# Patient Record
Sex: Female | Born: 1970 | Race: White | Hispanic: No | Marital: Married | Smoking: Current every day smoker
Health system: Southern US, Community
[De-identification: ages and names within clinical notes are randomized; demographics above are authoritative.]

## PROBLEM LIST (undated history)

## (undated) DIAGNOSIS — G894 Chronic pain syndrome: Secondary | ICD-10-CM

## (undated) DIAGNOSIS — J449 Chronic obstructive pulmonary disease, unspecified: Secondary | ICD-10-CM

## (undated) DIAGNOSIS — I10 Essential (primary) hypertension: Secondary | ICD-10-CM

## (undated) DIAGNOSIS — K760 Fatty (change of) liver, not elsewhere classified: Secondary | ICD-10-CM

## (undated) DIAGNOSIS — F17210 Nicotine dependence, cigarettes, uncomplicated: Secondary | ICD-10-CM

## (undated) DIAGNOSIS — R011 Cardiac murmur, unspecified: Secondary | ICD-10-CM

## (undated) DIAGNOSIS — K219 Gastro-esophageal reflux disease without esophagitis: Secondary | ICD-10-CM

## (undated) DIAGNOSIS — F0280 Dementia in other diseases classified elsewhere without behavioral disturbance: Secondary | ICD-10-CM

## (undated) DIAGNOSIS — E559 Vitamin D deficiency, unspecified: Secondary | ICD-10-CM

## (undated) DIAGNOSIS — R0600 Dyspnea, unspecified: Secondary | ICD-10-CM

## (undated) DIAGNOSIS — Z8489 Family history of other specified conditions: Secondary | ICD-10-CM

## (undated) DIAGNOSIS — E785 Hyperlipidemia, unspecified: Secondary | ICD-10-CM

## (undated) DIAGNOSIS — M797 Fibromyalgia: Secondary | ICD-10-CM

## (undated) DIAGNOSIS — M1990 Unspecified osteoarthritis, unspecified site: Secondary | ICD-10-CM

## (undated) DIAGNOSIS — D241 Benign neoplasm of right breast: Secondary | ICD-10-CM

## (undated) HISTORY — PX: OTHER SURGICAL HISTORY: SHX169

## (undated) HISTORY — PX: DILATION AND CURETTAGE OF UTERUS: SHX78

---

## 2005-12-16 ENCOUNTER — Emergency Department: Payer: Self-pay | Admitting: General Practice

## 2008-01-30 ENCOUNTER — Other Ambulatory Visit: Payer: Self-pay

## 2008-01-30 ENCOUNTER — Emergency Department: Payer: Self-pay | Admitting: Emergency Medicine

## 2008-04-09 ENCOUNTER — Emergency Department: Payer: Self-pay | Admitting: Internal Medicine

## 2008-04-09 ENCOUNTER — Ambulatory Visit: Payer: Self-pay | Admitting: Unknown Physician Specialty

## 2008-04-12 ENCOUNTER — Ambulatory Visit: Payer: Self-pay | Admitting: Unknown Physician Specialty

## 2010-05-05 ENCOUNTER — Ambulatory Visit: Payer: Self-pay | Admitting: Internal Medicine

## 2010-05-21 ENCOUNTER — Other Ambulatory Visit: Payer: Self-pay | Admitting: Internal Medicine

## 2010-11-21 ENCOUNTER — Ambulatory Visit: Payer: Self-pay | Admitting: Rheumatology

## 2011-03-04 ENCOUNTER — Ambulatory Visit: Payer: Self-pay

## 2011-03-27 ENCOUNTER — Other Ambulatory Visit: Payer: Self-pay | Admitting: Internal Medicine

## 2011-03-27 DIAGNOSIS — M797 Fibromyalgia: Secondary | ICD-10-CM

## 2011-03-27 MED ORDER — GABAPENTIN 400 MG PO CAPS: 400.0000 mg | ORAL_CAPSULE | Freq: Every day | ORAL | Status: DC

## 2011-04-03 ENCOUNTER — Ambulatory Visit: Payer: Self-pay | Admitting: Rheumatology

## 2011-09-10 ENCOUNTER — Telehealth: Payer: Self-pay | Admitting: *Deleted

## 2011-09-10 ENCOUNTER — Telehealth: Payer: Self-pay | Admitting: Internal Medicine

## 2011-09-10 NOTE — Telephone Encounter (Signed)
Caller: Sharmila/Patient; PCP: Duncan Dull; CB#: [phone]; ; ; Call regarding Back Pain;  Patient is calling about patient is having severe back pain that is also causing a flair up of her fibromyalgia.  Onset 08/23/11 but has recently gotten worse within last 2-3 days.   Patient is complaining of both lower and upper back pain.  Unable to sit up straight due to pain. Afebrile.  All emergent s/s r/o with exception to new onset of severe disabling back pain per Back Symptoms protocol.  Advised patient to go to ED for evaluation as office is closed, will go to [facility].

## 2011-09-10 NOTE — Telephone Encounter (Signed)
error 

## 2011-09-14 NOTE — Telephone Encounter (Signed)
[facility] Triage Call Report Triage Record Num: [id number] Operator: Tarri Glenn Patient Name: Andrea Frazier Call Date & Time: 09/10/2011 5:27:46PM Patient Phone: [phone] PCP: Duncan Dull Patient Gender: Female PCP Fax : [phone] Patient DOB: 01-Sep-1970 Practice Name: [facility] Reason for Call: Caller: [name]/Patient; PCP: Duncan Dull; CB#: [phone]; ; ; Call regarding Back Pain; Patient is calling about patient is having severe back pain that is also causing a flair up of her fibromyalgia. Onset 08/23/11 but has recently gotten worse within last 2-3 days. Patient is complaining of both lower and upper back pain. Unable to sit up straight due to pain. Afebrile. All emergent s/s r/o with exception to new onset of severe disabling back pain per Back Symptoms protocol. Advised patient to go to ED for evaluation as office is closed, will go to [facility]. Protocol(s) Used: Back Symptoms Recommended Outcome per Protocol: See ED Immediately Reason for Outcome: New onset of severe disabling back pain (unable to stand upright) Care Advice: ~ Another adult should drive. ~ Do not give the patient anything to eat or drink. Write down provider's name. List or place the following in a bag for transport with the patient: current prescription and/or nonprescription medications; alternative treatments, therapies and medications; and street drugs. ~ 09/10/2011 5:43:39PM Page 1 of 1 [id number]

## 2012-01-28 ENCOUNTER — Ambulatory Visit: Payer: Self-pay | Admitting: Internal Medicine

## 2012-09-15 ENCOUNTER — Ambulatory Visit: Payer: Self-pay | Admitting: Internal Medicine

## 2012-09-22 ENCOUNTER — Ambulatory Visit: Payer: Self-pay | Admitting: Internal Medicine

## 2012-09-26 ENCOUNTER — Ambulatory Visit: Payer: Self-pay | Admitting: Internal Medicine

## 2012-10-20 IMAGING — CR PELVIS - 1-2 VIEW
1 series · 1 of 1 positions shown · non-contrast
Comparison: none

REASON FOR EXAM: pain degenerative arthritis scolosis
COMMENTS:

[view not recorded]
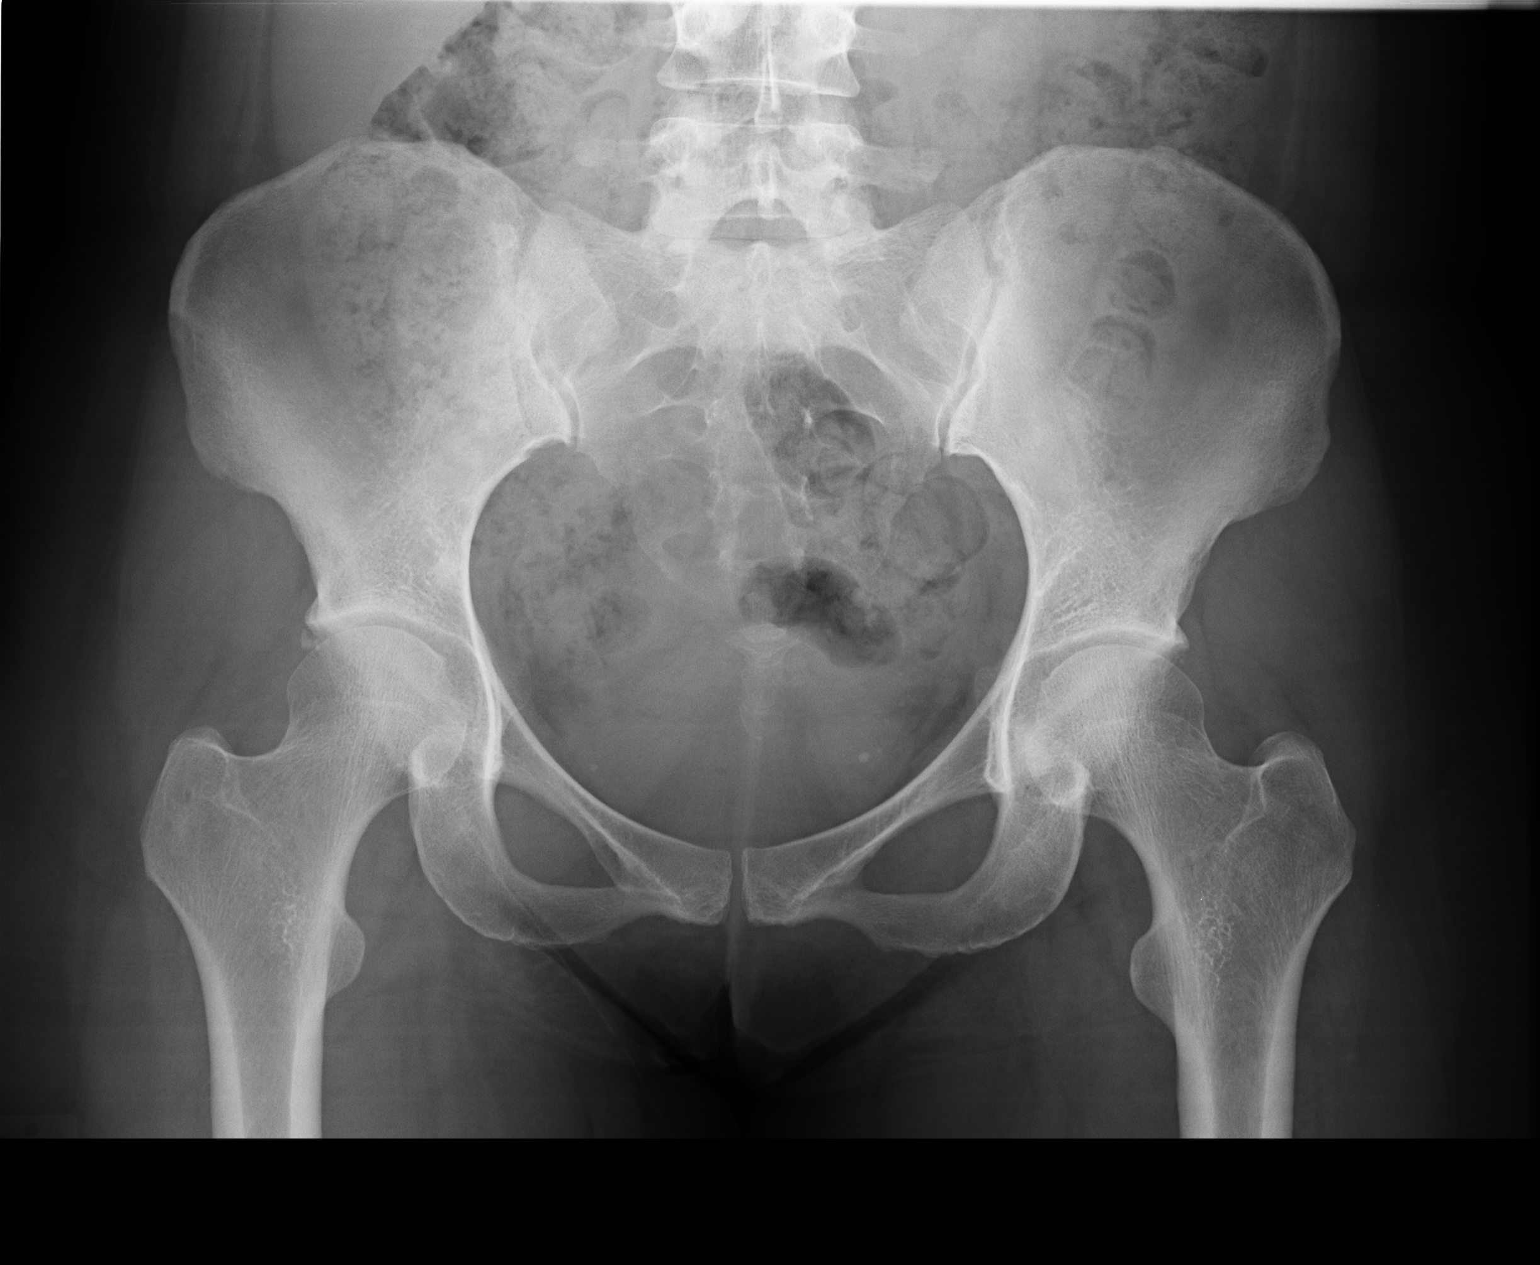

[1 of 1 positions shown; findings below may reference images not displayed]

PROCEDURE:     DXR - DXR PELVIS AP ONLY  - November 21, 2010  [DATE]

RESULT:     An AP view of the bony pelvis shows no fracture or other acute
bony abnormality. The joint spaces are bilaterally symmetrical. No arthritic
change about the hips is seen. The sacroiliac joints are normal in
appearance.
IMPRESSION: 1.     No significant abnormalities are noted.

## 2012-12-14 ENCOUNTER — Ambulatory Visit: Payer: Self-pay

## 2013-01-03 ENCOUNTER — Ambulatory Visit: Payer: Self-pay | Admitting: Unknown Physician Specialty

## 2013-05-19 ENCOUNTER — Ambulatory Visit: Payer: Self-pay | Admitting: Unknown Physician Specialty

## 2014-05-27 ENCOUNTER — Emergency Department: Payer: Self-pay | Admitting: Internal Medicine

## 2014-05-27 DIAGNOSIS — G839 Paralytic syndrome, unspecified: Secondary | ICD-10-CM | POA: Diagnosis not present

## 2014-05-27 DIAGNOSIS — Z79891 Long term (current) use of opiate analgesic: Secondary | ICD-10-CM | POA: Diagnosis not present

## 2014-05-27 DIAGNOSIS — Z79899 Other long term (current) drug therapy: Secondary | ICD-10-CM | POA: Diagnosis not present

## 2014-05-27 DIAGNOSIS — Z720 Tobacco use: Secondary | ICD-10-CM | POA: Diagnosis not present

## 2014-05-27 DIAGNOSIS — R531 Weakness: Secondary | ICD-10-CM | POA: Diagnosis not present

## 2014-05-27 DIAGNOSIS — Z792 Long term (current) use of antibiotics: Secondary | ICD-10-CM | POA: Diagnosis not present

## 2014-05-27 DIAGNOSIS — Z7952 Long term (current) use of systemic steroids: Secondary | ICD-10-CM | POA: Diagnosis not present

## 2014-05-27 LAB — CBC WITH DIFFERENTIAL/PLATELET
BASOS PCT: 0.6 %
Basophil #: 0 10*3/uL (ref 0.0–0.1)
EOS ABS: 0.4 10*3/uL (ref 0.0–0.7)
EOS PCT: 4.5 %
HCT: 45.2 % (ref 35.0–47.0)
HGB: 15 g/dL (ref 12.0–16.0)
LYMPHS ABS: 3.5 10*3/uL (ref 1.0–3.6)
LYMPHS PCT: 42.9 %
MCH: 30.9 pg (ref 26.0–34.0)
MCHC: 33.2 g/dL (ref 32.0–36.0)
MCV: 93 fL (ref 80–100)
MONO ABS: 0.6 x10 3/mm (ref 0.2–0.9)
Monocyte %: 7.6 %
NEUTROS ABS: 3.6 10*3/uL (ref 1.4–6.5)
NEUTROS PCT: 44.4 %
Platelet: 220 10*3/uL (ref 150–440)
RBC: 4.86 10*6/uL (ref 3.80–5.20)
RDW: 13.8 % (ref 11.5–14.5)
WBC: 8.1 10*3/uL (ref 3.6–11.0)

## 2014-05-27 LAB — BASIC METABOLIC PANEL
Anion Gap: 6 — ABNORMAL LOW (ref 7–16)
BUN: 10 mg/dL (ref 7–18)
CALCIUM: 8.1 mg/dL — AB (ref 8.5–10.1)
CHLORIDE: 103 mmol/L (ref 98–107)
Co2: 29 mmol/L (ref 21–32)
Creatinine: 0.64 mg/dL (ref 0.60–1.30)
GLUCOSE: 82 mg/dL (ref 65–99)
OSMOLALITY: 274 (ref 275–301)
Potassium: 3.6 mmol/L (ref 3.5–5.1)
SODIUM: 138 mmol/L (ref 136–145)

## 2014-05-27 LAB — URINALYSIS, COMPLETE
BILIRUBIN, UR: NEGATIVE
Bacteria: NONE SEEN
GLUCOSE, UR: NEGATIVE mg/dL (ref 0–75)
KETONE: NEGATIVE
NITRITE: NEGATIVE
PH: 7 (ref 4.5–8.0)
Protein: NEGATIVE
RBC,UR: 474 /HPF (ref 0–5)
Specific Gravity: 1.014 (ref 1.003–1.030)
WBC UR: 2 /HPF (ref 0–5)

## 2014-05-27 LAB — TROPONIN I: Troponin-I: <0.02 ng/mL

## 2014-05-31 DIAGNOSIS — G5631 Lesion of radial nerve, right upper limb: Secondary | ICD-10-CM | POA: Diagnosis not present

## 2014-06-26 DIAGNOSIS — M609 Myositis, unspecified: Secondary | ICD-10-CM | POA: Diagnosis not present

## 2014-06-26 DIAGNOSIS — M5126 Other intervertebral disc displacement, lumbar region: Secondary | ICD-10-CM | POA: Diagnosis not present

## 2014-06-26 DIAGNOSIS — M791 Myalgia: Secondary | ICD-10-CM | POA: Diagnosis not present

## 2014-06-26 DIAGNOSIS — F17200 Nicotine dependence, unspecified, uncomplicated: Secondary | ICD-10-CM | POA: Diagnosis not present

## 2014-06-26 DIAGNOSIS — N319 Neuromuscular dysfunction of bladder, unspecified: Secondary | ICD-10-CM | POA: Diagnosis not present

## 2014-06-26 DIAGNOSIS — Z1389 Encounter for screening for other disorder: Secondary | ICD-10-CM | POA: Diagnosis not present

## 2014-08-23 ENCOUNTER — Ambulatory Visit: Payer: Self-pay | Admitting: Internal Medicine

## 2014-10-01 ENCOUNTER — Ambulatory Visit
Admit: 2014-10-01 | Disposition: A | Discharge status: Home / Self Care | Payer: Self-pay | Attending: Anesthesiology | Admitting: Anesthesiology
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
## 2015-07-04 DIAGNOSIS — M5126 Other intervertebral disc displacement, lumbar region: Secondary | ICD-10-CM | POA: Diagnosis not present

## 2015-07-04 DIAGNOSIS — Z882 Allergy status to sulfonamides status: Secondary | ICD-10-CM | POA: Diagnosis not present

## 2015-07-04 DIAGNOSIS — M797 Fibromyalgia: Secondary | ICD-10-CM | POA: Diagnosis not present

## 2015-07-04 DIAGNOSIS — R5382 Chronic fatigue, unspecified: Secondary | ICD-10-CM | POA: Diagnosis not present

## 2015-08-02 DIAGNOSIS — M797 Fibromyalgia: Secondary | ICD-10-CM | POA: Diagnosis not present

## 2015-08-02 DIAGNOSIS — E784 Other hyperlipidemia: Secondary | ICD-10-CM | POA: Diagnosis not present

## 2015-08-02 DIAGNOSIS — I10 Essential (primary) hypertension: Secondary | ICD-10-CM | POA: Diagnosis not present

## 2015-08-02 DIAGNOSIS — M5126 Other intervertebral disc displacement, lumbar region: Secondary | ICD-10-CM | POA: Diagnosis not present

## 2015-08-02 DIAGNOSIS — Z882 Allergy status to sulfonamides status: Secondary | ICD-10-CM | POA: Diagnosis not present

## 2015-08-02 DIAGNOSIS — G894 Chronic pain syndrome: Secondary | ICD-10-CM | POA: Diagnosis not present

## 2015-08-02 DIAGNOSIS — R5381 Other malaise: Secondary | ICD-10-CM | POA: Diagnosis not present

## 2015-08-02 DIAGNOSIS — R5382 Chronic fatigue, unspecified: Secondary | ICD-10-CM | POA: Diagnosis not present

## 2015-08-27 DIAGNOSIS — M5126 Other intervertebral disc displacement, lumbar region: Secondary | ICD-10-CM | POA: Diagnosis not present

## 2015-08-27 DIAGNOSIS — M797 Fibromyalgia: Secondary | ICD-10-CM | POA: Diagnosis not present

## 2015-08-27 DIAGNOSIS — R5382 Chronic fatigue, unspecified: Secondary | ICD-10-CM | POA: Diagnosis not present

## 2015-09-26 DIAGNOSIS — M5126 Other intervertebral disc displacement, lumbar region: Secondary | ICD-10-CM | POA: Diagnosis not present

## 2015-09-26 DIAGNOSIS — R5382 Chronic fatigue, unspecified: Secondary | ICD-10-CM | POA: Diagnosis not present

## 2015-09-26 DIAGNOSIS — M797 Fibromyalgia: Secondary | ICD-10-CM | POA: Diagnosis not present

## 2015-10-25 DIAGNOSIS — M5126 Other intervertebral disc displacement, lumbar region: Secondary | ICD-10-CM | POA: Diagnosis not present

## 2015-10-25 DIAGNOSIS — R5382 Chronic fatigue, unspecified: Secondary | ICD-10-CM | POA: Diagnosis not present

## 2015-10-25 DIAGNOSIS — M797 Fibromyalgia: Secondary | ICD-10-CM | POA: Diagnosis not present

## 2015-11-22 DIAGNOSIS — R5382 Chronic fatigue, unspecified: Secondary | ICD-10-CM | POA: Diagnosis not present

## 2015-11-22 DIAGNOSIS — M5126 Other intervertebral disc displacement, lumbar region: Secondary | ICD-10-CM | POA: Diagnosis not present

## 2015-11-22 DIAGNOSIS — M797 Fibromyalgia: Secondary | ICD-10-CM | POA: Diagnosis not present

## 2015-11-22 DIAGNOSIS — N319 Neuromuscular dysfunction of bladder, unspecified: Secondary | ICD-10-CM | POA: Diagnosis not present

## 2015-12-10 DIAGNOSIS — Z882 Allergy status to sulfonamides status: Secondary | ICD-10-CM | POA: Diagnosis not present

## 2015-12-10 DIAGNOSIS — R5382 Chronic fatigue, unspecified: Secondary | ICD-10-CM | POA: Diagnosis not present

## 2015-12-10 DIAGNOSIS — M5126 Other intervertebral disc displacement, lumbar region: Secondary | ICD-10-CM | POA: Diagnosis not present

## 2015-12-10 DIAGNOSIS — M797 Fibromyalgia: Secondary | ICD-10-CM | POA: Diagnosis not present

## 2015-12-24 DIAGNOSIS — Z882 Allergy status to sulfonamides status: Secondary | ICD-10-CM | POA: Diagnosis not present

## 2015-12-24 DIAGNOSIS — N319 Neuromuscular dysfunction of bladder, unspecified: Secondary | ICD-10-CM | POA: Diagnosis not present

## 2015-12-24 DIAGNOSIS — M797 Fibromyalgia: Secondary | ICD-10-CM | POA: Diagnosis not present

## 2015-12-24 DIAGNOSIS — R5382 Chronic fatigue, unspecified: Secondary | ICD-10-CM | POA: Diagnosis not present

## 2016-01-21 DIAGNOSIS — R5382 Chronic fatigue, unspecified: Secondary | ICD-10-CM | POA: Diagnosis not present

## 2016-01-21 DIAGNOSIS — M797 Fibromyalgia: Secondary | ICD-10-CM | POA: Diagnosis not present

## 2016-01-21 DIAGNOSIS — M5126 Other intervertebral disc displacement, lumbar region: Secondary | ICD-10-CM | POA: Diagnosis not present

## 2016-01-21 DIAGNOSIS — N319 Neuromuscular dysfunction of bladder, unspecified: Secondary | ICD-10-CM | POA: Diagnosis not present

## 2016-02-20 DIAGNOSIS — S21209A Unspecified open wound of unspecified back wall of thorax without penetration into thoracic cavity, initial encounter: Secondary | ICD-10-CM | POA: Diagnosis not present

## 2016-02-20 DIAGNOSIS — N319 Neuromuscular dysfunction of bladder, unspecified: Secondary | ICD-10-CM | POA: Diagnosis not present

## 2016-02-20 DIAGNOSIS — M5430 Sciatica, unspecified side: Secondary | ICD-10-CM | POA: Diagnosis not present

## 2016-02-20 DIAGNOSIS — Z882 Allergy status to sulfonamides status: Secondary | ICD-10-CM | POA: Diagnosis not present

## 2016-03-11 DIAGNOSIS — R300 Dysuria: Secondary | ICD-10-CM | POA: Diagnosis not present

## 2016-03-11 DIAGNOSIS — M546 Pain in thoracic spine: Secondary | ICD-10-CM | POA: Diagnosis not present

## 2016-03-11 DIAGNOSIS — W19XXXA Unspecified fall, initial encounter: Secondary | ICD-10-CM | POA: Diagnosis not present

## 2016-03-11 DIAGNOSIS — M47814 Spondylosis without myelopathy or radiculopathy, thoracic region: Secondary | ICD-10-CM | POA: Diagnosis not present

## 2016-03-11 DIAGNOSIS — M549 Dorsalgia, unspecified: Secondary | ICD-10-CM | POA: Diagnosis not present

## 2016-03-19 DIAGNOSIS — R5381 Other malaise: Secondary | ICD-10-CM | POA: Diagnosis not present

## 2016-03-19 DIAGNOSIS — R5382 Chronic fatigue, unspecified: Secondary | ICD-10-CM | POA: Diagnosis not present

## 2016-03-19 DIAGNOSIS — Z882 Allergy status to sulfonamides status: Secondary | ICD-10-CM | POA: Diagnosis not present

## 2016-03-19 DIAGNOSIS — M797 Fibromyalgia: Secondary | ICD-10-CM | POA: Diagnosis not present

## 2016-03-19 DIAGNOSIS — M5126 Other intervertebral disc displacement, lumbar region: Secondary | ICD-10-CM | POA: Diagnosis not present

## 2016-04-17 DIAGNOSIS — M5126 Other intervertebral disc displacement, lumbar region: Secondary | ICD-10-CM | POA: Diagnosis not present

## 2016-04-17 DIAGNOSIS — N319 Neuromuscular dysfunction of bladder, unspecified: Secondary | ICD-10-CM | POA: Diagnosis not present

## 2016-04-17 DIAGNOSIS — M797 Fibromyalgia: Secondary | ICD-10-CM | POA: Diagnosis not present

## 2016-04-17 DIAGNOSIS — R5382 Chronic fatigue, unspecified: Secondary | ICD-10-CM | POA: Diagnosis not present

## 2016-04-23 ENCOUNTER — Encounter: Payer: Self-pay | Admitting: Obstetrics and Gynecology

## 2016-04-25 IMAGING — CT CT HEAD WITHOUT CONTRAST
1 series · 16 of 30 positions shown, 20 images · non-contrast
Comparison: None.

CLINICAL DATA: Right hand weakness today.  Initial encounter.

EXAM:
CT HEAD WITHOUT CONTRAST
TECHNIQUE: Contiguous axial images were obtained from the base of the skull
through the vertex without intravenous contrast.

[Series 2: head wo · axial · 0.44mm/px · z∈[-162,-36]mm · 16 of 32 slices shown, 20 images]
[im 2/32  brain]
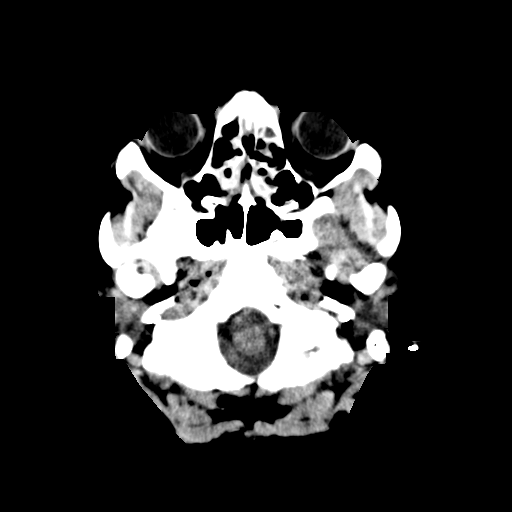
[im 2/32  bone]
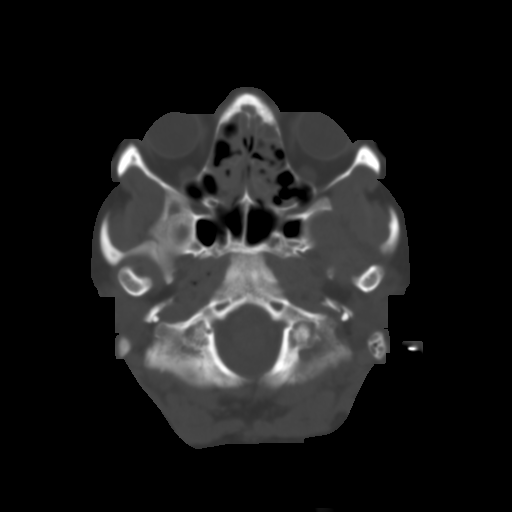
[im 4/32  brain]
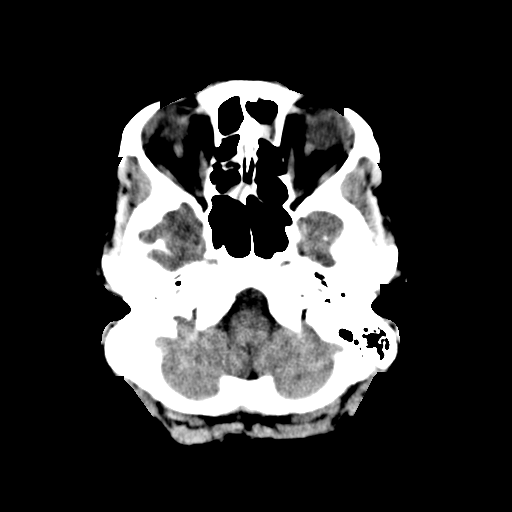
[im 6/32  brain]
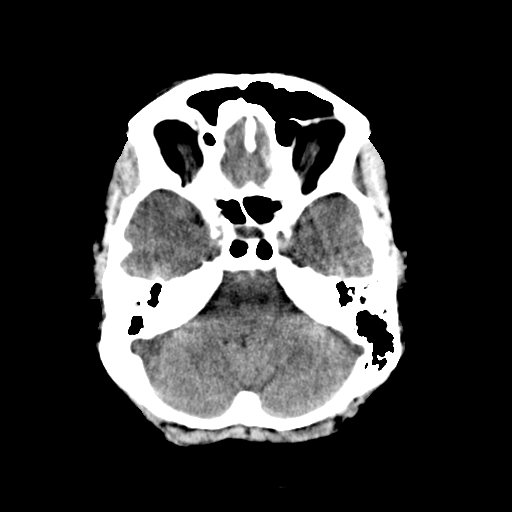
[im 8/32  brain]
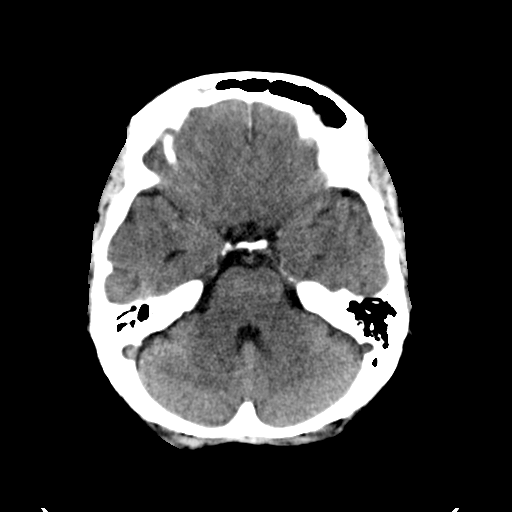
[im 9/32  brain]
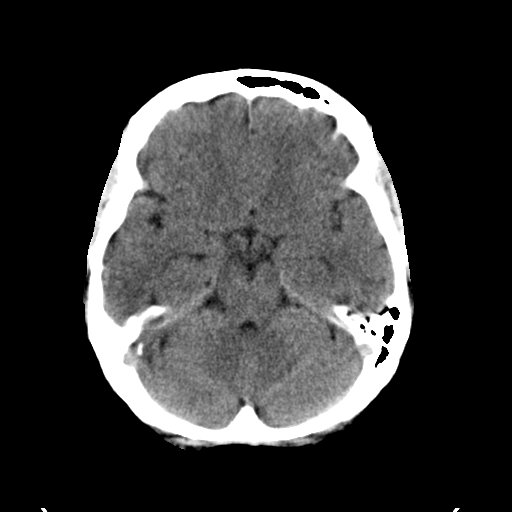
[im 9/32  bone]
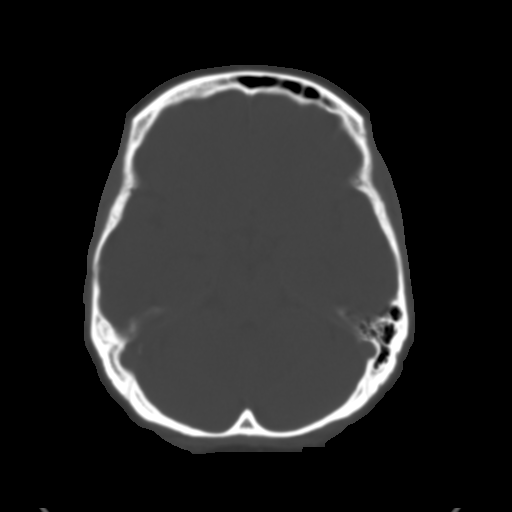
[im 11/32  brain]
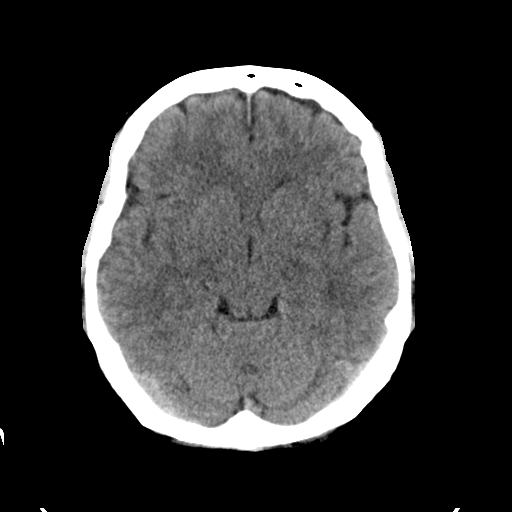
[im 13/32  brain]
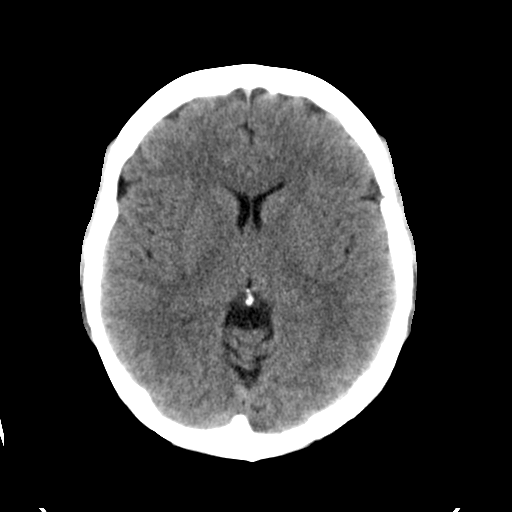
[im 15/32  brain]
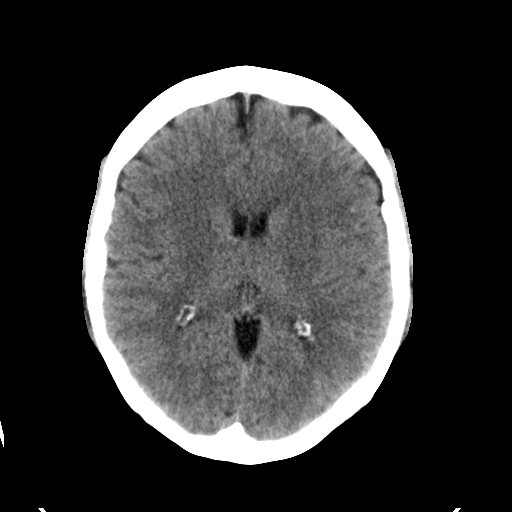
[im 17/32  brain]
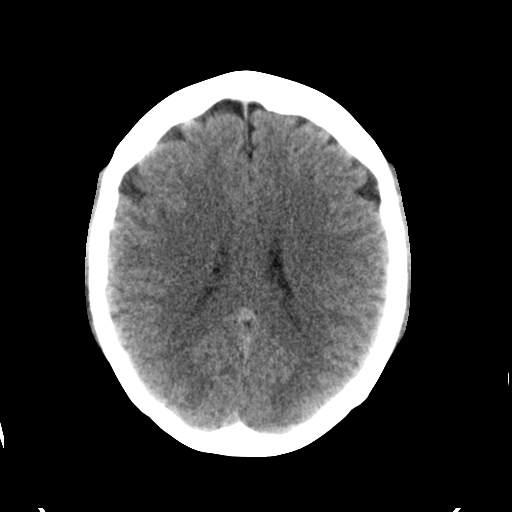
[im 17/32  bone]
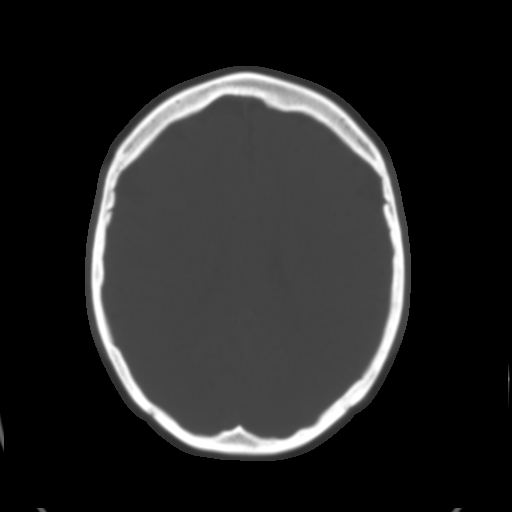
[im 19/32  brain]
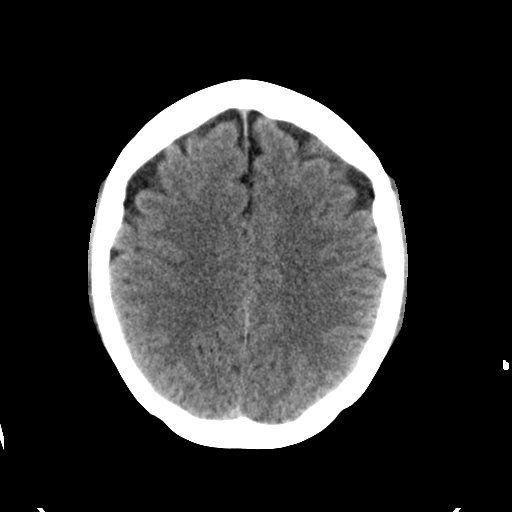
[im 21/32  brain]
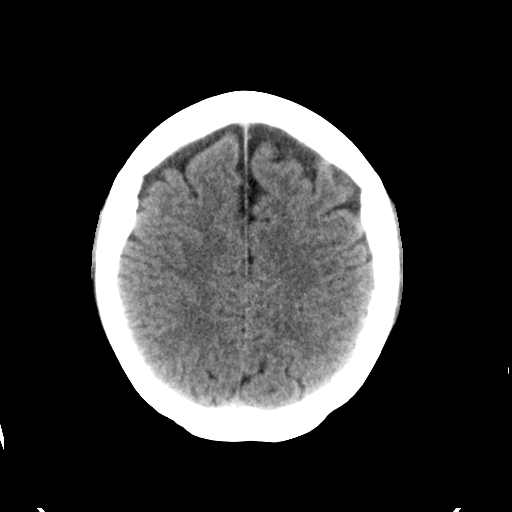
[im 23/32  brain]
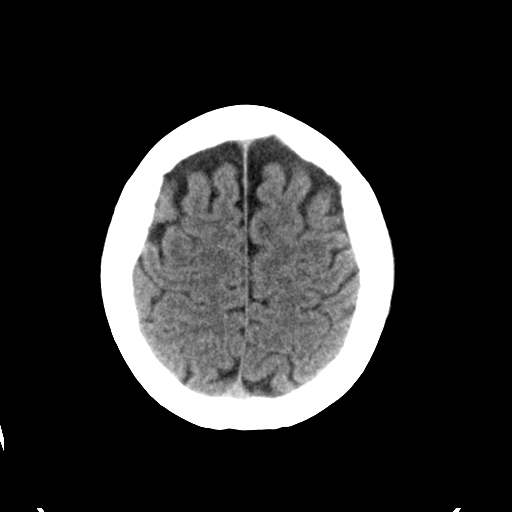
[im 24/32  brain]
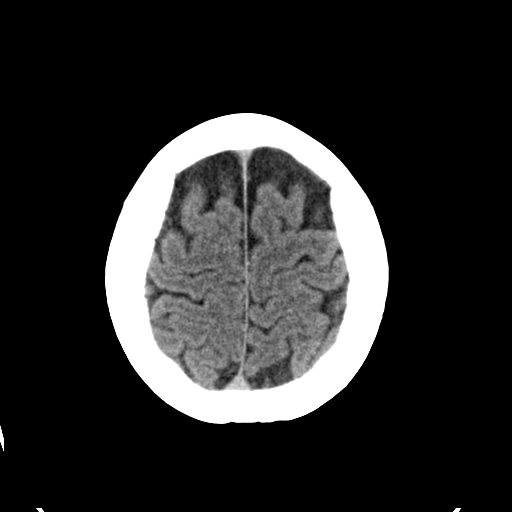
[im 24/32  bone]
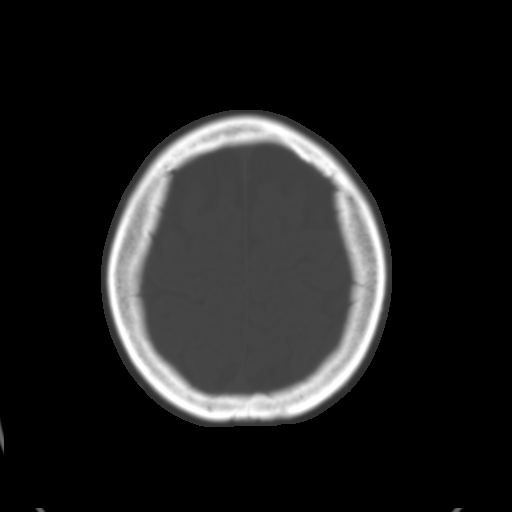
[im 26/32  brain]
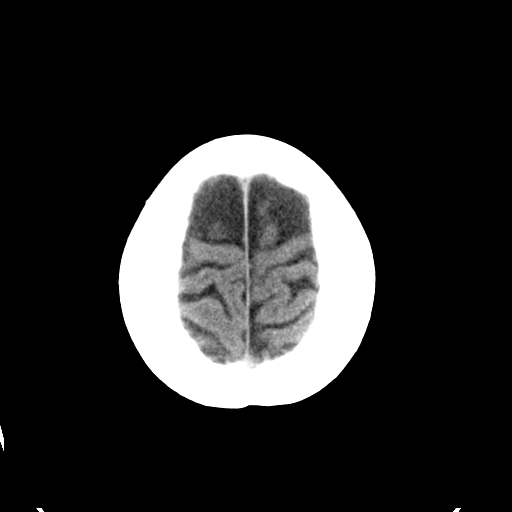
[im 28/32  brain]
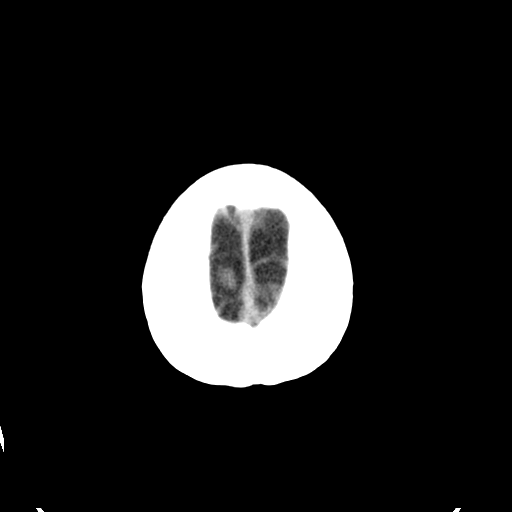
[im 30/32  brain]
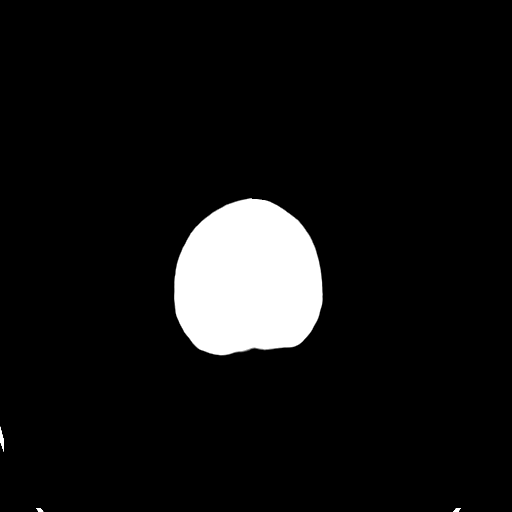

[16 of 30 positions shown; findings below may reference images not displayed]

FINDINGS: There is no evidence of acute intracranial hemorrhage, mass lesion,
brain edema or extra-axial fluid collection. The ventricles and
subarachnoid spaces are appropriately sized for age. There is no CT
evidence of acute cortical infarction.

There is mucosal thickening throughout the ethmoid and frontal
sinuses without air-fluid levels. The visualized sphenoid and
maxillary sinuses are clear. The mastoid air cells on the right are
hypoplastic but not opacified. The middle ears are clear. The
calvarium is intact.
IMPRESSION: 1. No acute intracranial findings.  No CT evidence of acute stroke.
2. Ethmoid and frontal sinus disease.

## 2016-05-18 DIAGNOSIS — N319 Neuromuscular dysfunction of bladder, unspecified: Secondary | ICD-10-CM | POA: Diagnosis not present

## 2016-05-18 DIAGNOSIS — R5382 Chronic fatigue, unspecified: Secondary | ICD-10-CM | POA: Diagnosis not present

## 2016-05-18 DIAGNOSIS — M5126 Other intervertebral disc displacement, lumbar region: Secondary | ICD-10-CM | POA: Diagnosis not present

## 2016-05-18 DIAGNOSIS — M797 Fibromyalgia: Secondary | ICD-10-CM | POA: Diagnosis not present

## 2016-05-27 ENCOUNTER — Encounter: Payer: Self-pay | Admitting: Obstetrics and Gynecology

## 2016-06-18 DIAGNOSIS — R5382 Chronic fatigue, unspecified: Secondary | ICD-10-CM | POA: Diagnosis not present

## 2016-06-18 DIAGNOSIS — M5126 Other intervertebral disc displacement, lumbar region: Secondary | ICD-10-CM | POA: Diagnosis not present

## 2016-06-18 DIAGNOSIS — Z8269 Family history of other diseases of the musculoskeletal system and connective tissue: Secondary | ICD-10-CM | POA: Diagnosis not present

## 2016-06-18 DIAGNOSIS — N319 Neuromuscular dysfunction of bladder, unspecified: Secondary | ICD-10-CM | POA: Diagnosis not present

## 2016-07-01 ENCOUNTER — Encounter: Payer: Self-pay | Admitting: Obstetrics and Gynecology

## 2016-07-02 ENCOUNTER — Encounter: Payer: Self-pay | Admitting: Obstetrics and Gynecology

## 2016-07-06 ENCOUNTER — Encounter: Payer: Self-pay | Admitting: Obstetrics and Gynecology

## 2016-07-06 ENCOUNTER — Ambulatory Visit (INDEPENDENT_AMBULATORY_CARE_PROVIDER_SITE_OTHER): Payer: Self-pay | Admitting: Obstetrics and Gynecology

## 2016-07-06 VITALS — BP 132/87 | HR 85 | Ht 63.5 in | Wt 187.2 lb

## 2016-07-06 DIAGNOSIS — N938 Other specified abnormal uterine and vaginal bleeding: Secondary | ICD-10-CM

## 2016-07-06 DIAGNOSIS — N946 Dysmenorrhea, unspecified: Secondary | ICD-10-CM

## 2016-07-06 DIAGNOSIS — R102 Pelvic and perineal pain: Secondary | ICD-10-CM

## 2016-07-06 NOTE — Progress Notes (Signed)
HPI:      [name] is a 46 y.o. G1P0010 who LMP was Patient's last menstrual period was 06/15/2016 (exact date)..  Subjective: She presents today with complaint of long-standing back and pelvic pain. She states that she has had this for many years. She says that for the last 5 months her pain is much worse with her menses. She reports a remote history of endometriosis found at previous surgery. She reports a tubal ligation. She also complains that on some months she has 2 menses instead of 1. Currently taking multiple pain medications prescribed by others. She describes having a previous hysterectomy scheduled 9 years ago but this was then canceled because "I didn't have insurance"     Hx: The following portions of the patient's history were reviewed and updated as appropriate:            She  has no past medical history on file. She  does not have a problem list on file. She  has a past surgical history that includes Dilation and curettage of uterus. She has a current medication list which includes the following prescription(s): clonazepam, gabapentin, hydrocodone-acetaminophen, rosuvastatin, and tramadol. No current outpatient prescriptions on file prior to visit.   No current facility-administered medications on file prior to visit.           ROS: Constitutional: Denied constitutional symptoms, night sweats, recent illness, fatigue, fever, insomnia and weight loss.  Eyes: Denied eye symptoms, eye pain, photophobia, vision change and visual disturbance.  Ears/Nose/Throat/Neck: Denied ear, nose, throat or neck symptoms, hearing loss, nasal discharge, sinus congestion and sore throat.  Cardiovascular: Denied cardiovascular symptoms, arrhythmia, chest pain/pressure, edema, exercise intolerance, orthopnea and palpitations.  Respiratory: Denied pulmonary symptoms, asthma, pleuritic pain, productive sputum, cough, dyspnea and wheezing.  Gastrointestinal: Denied, gastro-esophageal  reflux, melena, nausea and vomiting.  Genitourinary: See HPI for additional information.  Musculoskeletal: Denied musculoskeletal symptoms, stiffness, swelling, muscle weakness and myalgia.  Dermatologic: Denied dermatology symptoms, rash and scar.  Neurologic: Denied neurology symptoms, dizziness, headache, neck pain and syncope.  Psychiatric: Denied psychiatric symptoms, anxiety and depression.  Endocrine: Denied endocrine symptoms including hot flashes and night sweats.     Objective: Vitals:   07/06/16 1525  BP: 132/87  Pulse: 85      Assessment: Pelvic pain in female  Dysmenorrhea  DUB (dysfunctional uterine bleeding)   Plan:             Orders No orders of the defined types were placed in this encounter.   1.  Postmenstrual ultrasound for endometrial thickness. Ultrasound for pelvic pain to include examination of ovaries and myometrium. Consider endometrial biopsy if endometrial lining thickened.        F/U  Return in about 1 week (around 07/13/2016).  Patient to follow up 1 week after ultrasound.  Finis Bud, M.D. 07/06/2016 4:27 PM

## 2016-07-06 NOTE — Progress Notes (Signed)
Pt states she has a lot of pain in lower abdomen, lower back, and hips radiating down legs. States she has 2 periods a month. Pt states she has a history of endometriosis.

## 2016-07-21 DIAGNOSIS — Z882 Allergy status to sulfonamides status: Secondary | ICD-10-CM | POA: Diagnosis not present

## 2016-07-21 DIAGNOSIS — M5126 Other intervertebral disc displacement, lumbar region: Secondary | ICD-10-CM | POA: Diagnosis not present

## 2016-07-21 DIAGNOSIS — M797 Fibromyalgia: Secondary | ICD-10-CM | POA: Diagnosis not present

## 2016-07-21 DIAGNOSIS — R5382 Chronic fatigue, unspecified: Secondary | ICD-10-CM | POA: Diagnosis not present

## 2016-07-22 ENCOUNTER — Ambulatory Visit (INDEPENDENT_AMBULATORY_CARE_PROVIDER_SITE_OTHER): Payer: Self-pay

## 2016-07-22 DIAGNOSIS — N946 Dysmenorrhea, unspecified: Secondary | ICD-10-CM

## 2016-07-22 DIAGNOSIS — R102 Pelvic and perineal pain: Secondary | ICD-10-CM

## 2016-07-22 DIAGNOSIS — N938 Other specified abnormal uterine and vaginal bleeding: Secondary | ICD-10-CM

## 2016-07-28 ENCOUNTER — Encounter: Payer: Self-pay | Admitting: Obstetrics and Gynecology

## 2016-07-28 ENCOUNTER — Ambulatory Visit (INDEPENDENT_AMBULATORY_CARE_PROVIDER_SITE_OTHER): Payer: Self-pay | Admitting: Obstetrics and Gynecology

## 2016-07-28 VITALS — BP 125/71 | HR 86 | Wt 187.5 lb

## 2016-07-28 DIAGNOSIS — N938 Other specified abnormal uterine and vaginal bleeding: Secondary | ICD-10-CM

## 2016-07-28 DIAGNOSIS — N946 Dysmenorrhea, unspecified: Secondary | ICD-10-CM

## 2016-07-28 NOTE — Progress Notes (Signed)
HPI:      [name] is a 45 y.o. G1P0010 who LMP was No LMP recorded.  Subjective: She presents today for follow-up of her ultrasound. Her complaints last week were of pain before her menstrual period during her menstrual period and after her menstrual period. Today she is complaining of the bottoms of her feet burning in pain. She describes to me her back pain neck pain shoulder pain leg pain which at this time she attributes to her uterus as the source. She says it is causing pain throughout her body.    Hx: The following portions of the patient's history were reviewed and updated as appropriate:         ROS: Constitutional: Denied constitutional symptoms, night sweats, recent illness, fatigue, fever, insomnia and weight loss.  Eyes: Denied eye symptoms, eye pain, photophobia, vision change and visual disturbance.  Ears/Nose/Throat/Neck: Denied ear, nose, throat or neck symptoms, hearing loss, nasal discharge, sinus congestion and sore throat.  Cardiovascular: Denied cardiovascular symptoms, arrhythmia, chest pain/pressure, edema, exercise intolerance, orthopnea and palpitations.  Respiratory: Denied pulmonary symptoms, asthma, pleuritic pain, productive sputum, cough, dyspnea and wheezing.  Gastrointestinal: Denied, gastro-esophageal reflux, melena, nausea and vomiting.  Genitourinary: See HPI for additional information.  Musculoskeletal: See HPI for additional information.  Dermatologic: Denied dermatology symptoms, rash and scar.  Neurologic: See HPI for additional information.  Psychiatric: Denied psychiatric symptoms, anxiety and depression.  Endocrine: Denied endocrine symptoms including hot flashes and night sweats.   Meds: She has a current medication list which includes the following prescription(s): clonazepam, gabapentin, hydrocodone-acetaminophen, rosuvastatin, and tramadol.  Objective: Vitals:   07/28/16 1534  BP: 125/71  Pulse: 86            Ultrasound  results reviewed with the patient we specifically discussed the very small calcified fibroid.   Assessment: 1. Dysfunctional uterine bleeding   2. Dysmenorrhea     Small myometrial fibroid-I do not think this is the source of her pain.  In addition to multiple other pain complaints she does describe dysmenorrhea and occasionally having dysfunctional bleeding. Plan:             1.  I have declined the option to perform hysterectomy. I do not think it will solve her problems. We have discussed other options for dysfunctional bleeding and dysmenorrhea and she has chosen to have an IUD inserted with the hope that relieving her menses will relieve her pain.          F/U  Return for She is to call at the start of next menses.   I spent 27 minutes with this patient of which greater than 50% was spent discussing her multiple pain issues, review of her ultrasound findings, relationship of pelvic pain to fibroids and specifically the uterus, use of IUD to control menstrual bleeding and dysmenorrhea, advantages and disadvantages of hysterectomy.  Finis Bud, M.D. 07/28/2016 4:07 PM

## 2016-08-05 DIAGNOSIS — G8929 Other chronic pain: Secondary | ICD-10-CM | POA: Insufficient documentation

## 2016-08-05 DIAGNOSIS — R102 Pelvic and perineal pain: Secondary | ICD-10-CM | POA: Diagnosis not present

## 2016-08-18 DIAGNOSIS — M5126 Other intervertebral disc displacement, lumbar region: Secondary | ICD-10-CM | POA: Diagnosis not present

## 2016-08-18 DIAGNOSIS — G894 Chronic pain syndrome: Secondary | ICD-10-CM | POA: Diagnosis not present

## 2016-08-18 DIAGNOSIS — Z8269 Family history of other diseases of the musculoskeletal system and connective tissue: Secondary | ICD-10-CM | POA: Diagnosis not present

## 2016-08-18 DIAGNOSIS — Z882 Allergy status to sulfonamides status: Secondary | ICD-10-CM | POA: Diagnosis not present

## 2016-08-18 DIAGNOSIS — N319 Neuromuscular dysfunction of bladder, unspecified: Secondary | ICD-10-CM | POA: Diagnosis not present

## 2016-08-31 ENCOUNTER — Telehealth: Payer: Self-pay

## 2016-08-31 ENCOUNTER — Other Ambulatory Visit: Payer: Self-pay

## 2016-08-31 DIAGNOSIS — R011 Cardiac murmur, unspecified: Secondary | ICD-10-CM | POA: Insufficient documentation

## 2016-08-31 NOTE — Telephone Encounter (Signed)
Patient has questions for her insurance company. She will call back to schedule colonoscopy after.

## 2016-09-15 DIAGNOSIS — N319 Neuromuscular dysfunction of bladder, unspecified: Secondary | ICD-10-CM | POA: Diagnosis not present

## 2016-09-15 DIAGNOSIS — D259 Leiomyoma of uterus, unspecified: Secondary | ICD-10-CM | POA: Diagnosis not present

## 2016-09-15 DIAGNOSIS — M5126 Other intervertebral disc displacement, lumbar region: Secondary | ICD-10-CM | POA: Diagnosis not present

## 2016-09-15 DIAGNOSIS — Z882 Allergy status to sulfonamides status: Secondary | ICD-10-CM | POA: Diagnosis not present

## 2016-10-19 DIAGNOSIS — N319 Neuromuscular dysfunction of bladder, unspecified: Secondary | ICD-10-CM | POA: Diagnosis not present

## 2016-10-19 DIAGNOSIS — R5382 Chronic fatigue, unspecified: Secondary | ICD-10-CM | POA: Diagnosis not present

## 2016-10-19 DIAGNOSIS — Z8269 Family history of other diseases of the musculoskeletal system and connective tissue: Secondary | ICD-10-CM | POA: Diagnosis not present

## 2016-10-19 DIAGNOSIS — M5126 Other intervertebral disc displacement, lumbar region: Secondary | ICD-10-CM | POA: Diagnosis not present

## 2016-11-19 DIAGNOSIS — M5126 Other intervertebral disc displacement, lumbar region: Secondary | ICD-10-CM | POA: Diagnosis not present

## 2016-11-19 DIAGNOSIS — D259 Leiomyoma of uterus, unspecified: Secondary | ICD-10-CM | POA: Diagnosis not present

## 2016-11-19 DIAGNOSIS — Z1389 Encounter for screening for other disorder: Secondary | ICD-10-CM | POA: Diagnosis not present

## 2016-11-19 DIAGNOSIS — N319 Neuromuscular dysfunction of bladder, unspecified: Secondary | ICD-10-CM | POA: Diagnosis not present

## 2016-11-19 DIAGNOSIS — Z882 Allergy status to sulfonamides status: Secondary | ICD-10-CM | POA: Diagnosis not present

## 2016-12-22 ENCOUNTER — Other Ambulatory Visit: Payer: Self-pay | Admitting: Internal Medicine

## 2016-12-22 DIAGNOSIS — R1011 Right upper quadrant pain: Secondary | ICD-10-CM

## 2016-12-31 DIAGNOSIS — G894 Chronic pain syndrome: Secondary | ICD-10-CM | POA: Diagnosis not present

## 2016-12-31 DIAGNOSIS — R5381 Other malaise: Secondary | ICD-10-CM | POA: Diagnosis not present

## 2017-01-05 ENCOUNTER — Ambulatory Visit
Admission: RE | Admit: 2017-01-05 | Discharge: 2017-01-05 | Disposition: A | Discharge status: Home / Self Care | Payer: PPO | Source: Ambulatory Visit | Attending: Internal Medicine | Admitting: Internal Medicine

## 2017-01-05 DIAGNOSIS — K760 Fatty (change of) liver, not elsewhere classified: Secondary | ICD-10-CM | POA: Insufficient documentation

## 2017-01-05 DIAGNOSIS — R1011 Right upper quadrant pain: Secondary | ICD-10-CM | POA: Diagnosis not present

## 2017-02-19 DIAGNOSIS — M797 Fibromyalgia: Secondary | ICD-10-CM | POA: Diagnosis not present

## 2017-02-19 DIAGNOSIS — R5382 Chronic fatigue, unspecified: Secondary | ICD-10-CM | POA: Diagnosis not present

## 2017-02-19 DIAGNOSIS — M5126 Other intervertebral disc displacement, lumbar region: Secondary | ICD-10-CM | POA: Diagnosis not present

## 2017-02-19 DIAGNOSIS — N319 Neuromuscular dysfunction of bladder, unspecified: Secondary | ICD-10-CM | POA: Diagnosis not present

## 2017-03-19 DIAGNOSIS — D259 Leiomyoma of uterus, unspecified: Secondary | ICD-10-CM | POA: Diagnosis not present

## 2017-03-19 DIAGNOSIS — R5382 Chronic fatigue, unspecified: Secondary | ICD-10-CM | POA: Diagnosis not present

## 2017-03-19 DIAGNOSIS — Z882 Allergy status to sulfonamides status: Secondary | ICD-10-CM | POA: Diagnosis not present

## 2017-03-19 DIAGNOSIS — Z8269 Family history of other diseases of the musculoskeletal system and connective tissue: Secondary | ICD-10-CM | POA: Diagnosis not present

## 2017-04-16 DIAGNOSIS — M5126 Other intervertebral disc displacement, lumbar region: Secondary | ICD-10-CM | POA: Diagnosis not present

## 2017-04-16 DIAGNOSIS — D259 Leiomyoma of uterus, unspecified: Secondary | ICD-10-CM | POA: Diagnosis not present

## 2017-04-16 DIAGNOSIS — R5382 Chronic fatigue, unspecified: Secondary | ICD-10-CM | POA: Diagnosis not present

## 2017-04-16 DIAGNOSIS — N319 Neuromuscular dysfunction of bladder, unspecified: Secondary | ICD-10-CM | POA: Diagnosis not present

## 2017-05-12 ENCOUNTER — Ambulatory Visit: Payer: PPO | Admitting: Anesthesiology

## 2017-05-17 DIAGNOSIS — Z882 Allergy status to sulfonamides status: Secondary | ICD-10-CM | POA: Diagnosis not present

## 2017-05-17 DIAGNOSIS — Z8269 Family history of other diseases of the musculoskeletal system and connective tissue: Secondary | ICD-10-CM | POA: Diagnosis not present

## 2017-05-17 DIAGNOSIS — R5382 Chronic fatigue, unspecified: Secondary | ICD-10-CM | POA: Diagnosis not present

## 2017-05-17 DIAGNOSIS — M797 Fibromyalgia: Secondary | ICD-10-CM | POA: Diagnosis not present

## 2017-06-15 DIAGNOSIS — M797 Fibromyalgia: Secondary | ICD-10-CM | POA: Diagnosis not present

## 2017-06-15 DIAGNOSIS — M546 Pain in thoracic spine: Secondary | ICD-10-CM | POA: Diagnosis not present

## 2017-06-15 DIAGNOSIS — G8929 Other chronic pain: Secondary | ICD-10-CM | POA: Diagnosis not present

## 2017-06-15 DIAGNOSIS — S299XXA Unspecified injury of thorax, initial encounter: Secondary | ICD-10-CM | POA: Diagnosis not present

## 2017-07-16 DIAGNOSIS — Z8269 Family history of other diseases of the musculoskeletal system and connective tissue: Secondary | ICD-10-CM | POA: Diagnosis not present

## 2017-07-16 DIAGNOSIS — M797 Fibromyalgia: Secondary | ICD-10-CM | POA: Diagnosis not present

## 2017-07-16 DIAGNOSIS — R5382 Chronic fatigue, unspecified: Secondary | ICD-10-CM | POA: Diagnosis not present

## 2017-07-16 DIAGNOSIS — Z882 Allergy status to sulfonamides status: Secondary | ICD-10-CM | POA: Diagnosis not present

## 2017-08-13 DIAGNOSIS — M5126 Other intervertebral disc displacement, lumbar region: Secondary | ICD-10-CM | POA: Diagnosis not present

## 2017-08-13 DIAGNOSIS — D259 Leiomyoma of uterus, unspecified: Secondary | ICD-10-CM | POA: Diagnosis not present

## 2017-08-13 DIAGNOSIS — Z8269 Family history of other diseases of the musculoskeletal system and connective tissue: Secondary | ICD-10-CM | POA: Diagnosis not present

## 2017-08-13 DIAGNOSIS — J439 Emphysema, unspecified: Secondary | ICD-10-CM | POA: Diagnosis not present

## 2017-08-26 ENCOUNTER — Ambulatory Visit: Payer: Self-pay | Admitting: Gastroenterology

## 2017-09-13 ENCOUNTER — Ambulatory Visit: Payer: Self-pay | Admitting: Gastroenterology

## 2017-09-14 ENCOUNTER — Ambulatory Visit (INDEPENDENT_AMBULATORY_CARE_PROVIDER_SITE_OTHER): Payer: Medicare HMO | Admitting: Gastroenterology

## 2017-09-14 ENCOUNTER — Other Ambulatory Visit
Admission: RE | Admit: 2017-09-14 | Discharge: 2017-09-14 | Disposition: A | Discharge status: Home / Self Care | Payer: Medicare HMO | Source: Ambulatory Visit | Attending: Gastroenterology | Admitting: Gastroenterology

## 2017-09-14 ENCOUNTER — Encounter (INDEPENDENT_AMBULATORY_CARE_PROVIDER_SITE_OTHER): Payer: Self-pay

## 2017-09-14 ENCOUNTER — Encounter: Payer: Self-pay | Admitting: Gastroenterology

## 2017-09-14 VITALS — BP 131/82 | HR 86 | Wt 196.4 lb

## 2017-09-14 DIAGNOSIS — Z800 Family history of malignant neoplasm of digestive organs: Secondary | ICD-10-CM | POA: Diagnosis not present

## 2017-09-14 DIAGNOSIS — K219 Gastro-esophageal reflux disease without esophagitis: Secondary | ICD-10-CM | POA: Diagnosis not present

## 2017-09-14 DIAGNOSIS — R5382 Chronic fatigue, unspecified: Secondary | ICD-10-CM | POA: Diagnosis not present

## 2017-09-14 DIAGNOSIS — Z1211 Encounter for screening for malignant neoplasm of colon: Secondary | ICD-10-CM

## 2017-09-14 DIAGNOSIS — D259 Leiomyoma of uterus, unspecified: Secondary | ICD-10-CM | POA: Diagnosis not present

## 2017-09-14 DIAGNOSIS — J439 Emphysema, unspecified: Secondary | ICD-10-CM | POA: Diagnosis not present

## 2017-09-14 DIAGNOSIS — M5126 Other intervertebral disc displacement, lumbar region: Secondary | ICD-10-CM | POA: Diagnosis not present

## 2017-09-14 LAB — CBC WITH DIFFERENTIAL/PLATELET
Basophils Absolute: 0 10*3/uL (ref 0–0.1)
Basophils Relative: 0 %
Eosinophils Absolute: 0.3 10*3/uL (ref 0–0.7)
Eosinophils Relative: 3 %
HEMATOCRIT: 45.9 % (ref 35.0–47.0)
Hemoglobin: 15 g/dL (ref 12.0–16.0)
Lymphocytes Relative: 37 %
Lymphs Abs: 3.6 10*3/uL (ref 1.0–3.6)
MCH: 29.8 pg (ref 26.0–34.0)
MCHC: 32.8 g/dL (ref 32.0–36.0)
MCV: 91 fL (ref 80.0–100.0)
MONO ABS: 0.8 10*3/uL (ref 0.2–0.9)
MONOS PCT: 8 %
NEUTROS ABS: 4.9 10*3/uL (ref 1.4–6.5)
Neutrophils Relative %: 52 %
Platelets: 227 10*3/uL (ref 150–440)
RBC: 5.04 MIL/uL (ref 3.80–5.20)
RDW: 13.2 % (ref 11.5–14.5)
WBC: 9.5 10*3/uL (ref 3.6–11.0)

## 2017-09-14 MED ORDER — PEG 3350-KCL-NA BICARB-NACL 420 G PO SOLR: 4000.0000 mL | Freq: Once | ORAL | 0 refills | Status: AC

## 2017-09-14 NOTE — Progress Notes (Signed)
Jonathon Bellows MD, MRCP(U.K) [address]  [place]  [place], [place] [address]  Main: [phone]  Fax: [phone]   Gastroenterology Consultation  Referring Provider:     Cletis Athens, MD Primary Care Physician:  Cletis Athens, MD Primary Gastroenterologist:  Dr. Jonathon Bellows  Reason for Consultation:     GERD,constipation         HPI:   Andrea Frazier is a 47 y.o. y/o female referred for consultation & management  by Dr. Cletis Athens, MD.    She has been referred for reflux, constipation and diarrhea.   Last colonoscopy some years back - no polyps- father had colon cancer and polyps.   She has had issues with diarrhea alternating with constipation. Long standing . Presently she has constipation . Regular bowel movement day.   Reflux:  Onset : all her life  Symptoms:  Acid in her throat , burning sensation in her chest  Recent weight gain: yes, she is a smoker  Medications: Omeprazole- one time a day - does not help , before meals  Narcotics or anticholinergics use : hydrocodone  PPI /H2 blockers or Antacid  use and timing :, bc powder 3 times for atleast 1 year  Dinner time : 11 pm , bed time is around around 3 am  Prior EGD: no  Family history of esophageal cancer:yes in her uncle.    She says that she sits up and all of a sudden comes up - at times has been back in color.    No past medical history on file.  Past Surgical History:  Procedure Laterality Date  . DILATION AND CURETTAGE OF UTERUS      Prior to Admission medications   Medication Sig Start Date End Date Taking? Authorizing Provider  clonazePAM (KLONOPIN) 0.5 MG tablet Take 0.5 mg by mouth 2 (two) times daily as needed for anxiety.    [provider]  gabapentin (NEURONTIN) 600 MG tablet Take by mouth.    [provider]  HYDROcodone-acetaminophen (NORCO) 7.5-325 MG tablet Take 1 tablet by mouth every 6 (six) hours as needed for moderate pain.    [provider]    rosuvastatin (CRESTOR) 20 MG tablet Take 20 mg by mouth daily.    [provider]  rosuvastatin (CRESTOR) 20 MG tablet  03/07/16   [provider]  traMADol (ULTRAM) 50 MG tablet Take by mouth every 6 (six) hours as needed.    [provider]    Family History  Problem Relation Age of Onset  . Rheum arthritis Sister   . Ovarian cancer Sister   . Cancer Maternal Aunt      Social History   Tobacco Use  . Smoking status: Current Every Day Smoker  . Smokeless tobacco: Never Used  Substance Use Topics  . Alcohol use: No  . Drug use: No    Allergies as of 09/14/2017 - Review Complete 07/28/2016  Allergen Reaction Noted  . Sulfa antibiotics Shortness Of Breath 09/14/2013    Review of Systems:    All systems reviewed and negative except where noted in HPI.   Physical Exam:  There were no vitals taken for this visit. No LMP recorded. Psych:  Alert and cooperative. Normal mood and affect. General:   Alert,  Well-developed, well-nourished, pleasant and cooperative in NAD Head:  Normocephalic and atraumatic. Eyes:  Sclera clear, no icterus.   Conjunctiva pink. Ears:  Normal auditory acuity. Nose:  No deformity, discharge, or lesions. Mouth:  No deformity or lesions,oropharynx pink & moist. Neck:  Supple; no masses or thyromegaly. Lungs:  Respirations even and unlabored.  Clear throughout to auscultation.   No wheezes, crackles, or rhonchi. No acute distress. Heart:  Regular rate and rhythm; no murmurs, clicks, rubs, or gallops. Abdomen:  Normal bowel sounds.  No bruits.  Soft, non-tender and non-distended without masses, hepatosplenomegaly or hernias noted.  No guarding or rebound tenderness.    Extremities:  No clubbing or edema.  No cyanosis. Neurologic:  Alert and oriented x3;  grossly normal neurologically. Skin:  Intact without significant lesions or rashes. No jaundice. Lymph Nodes:  No significant cervical adenopathy. Psych:  Alert and  cooperative. Normal mood and affect.  Imaging Studies: No results found.  Assessment and Plan:   Andrea Frazier is a 47 y.o. y/o female has been referred for GERD, constipation .She is due for colon cancer screening due to family history of colon cancer    Plan  1. Colonoscopy +EGD 2. Stop NSAID's 3. Continue PPI- increase to twice a day  4. Stop smoking  5. High fiber diet  6. Change diet - she consumes ice cream as the primary item in her meals   Counseled on life style changes, suggest to use PPI first thing in the morning on empty stomach and eat 30 minutes after. Advised on the use of a wedge pillow at night , avoid meals for 2 hours prior to bed time.    I have discussed alternative options, risks & benefits,  which include, but are not limited to, bleeding, infection, perforation,respiratory complication & drug reaction.  The patient agrees with this plan & written consent will be obtained.    Follow up in 6 weeks   Dr Jonathon Bellows MD,MRCP(U.K)

## 2017-09-14 NOTE — Addendum Note (Signed)
Addended by: Peggye Ley on: 09/14/2017 12:49 PM   Modules accepted: Orders, SmartSet

## 2017-09-16 ENCOUNTER — Encounter: Payer: Self-pay | Admitting: Gastroenterology

## 2017-09-21 ENCOUNTER — Ambulatory Visit
Admission: RE | Admit: 2017-09-21 | Discharge: 2017-09-21 | Disposition: A | Discharge status: Home / Self Care | Payer: Medicare HMO | Source: Ambulatory Visit | Attending: Gastroenterology | Admitting: Gastroenterology

## 2017-09-21 ENCOUNTER — Encounter: Payer: Self-pay | Admitting: Anesthesiology

## 2017-09-21 ENCOUNTER — Ambulatory Visit: Payer: Medicare HMO | Admitting: Anesthesiology

## 2017-09-21 ENCOUNTER — Encounter
Admission: RE | Disposition: A | Discharge status: Home / Self Care | Payer: Self-pay | Source: Ambulatory Visit | Attending: Gastroenterology

## 2017-09-21 DIAGNOSIS — Z882 Allergy status to sulfonamides status: Secondary | ICD-10-CM | POA: Insufficient documentation

## 2017-09-21 DIAGNOSIS — R12 Heartburn: Secondary | ICD-10-CM | POA: Diagnosis not present

## 2017-09-21 DIAGNOSIS — Z1211 Encounter for screening for malignant neoplasm of colon: Secondary | ICD-10-CM | POA: Diagnosis not present

## 2017-09-21 DIAGNOSIS — Z800 Family history of malignant neoplasm of digestive organs: Secondary | ICD-10-CM | POA: Diagnosis not present

## 2017-09-21 DIAGNOSIS — K219 Gastro-esophageal reflux disease without esophagitis: Secondary | ICD-10-CM | POA: Diagnosis not present

## 2017-09-21 DIAGNOSIS — R69 Illness, unspecified: Secondary | ICD-10-CM | POA: Diagnosis not present

## 2017-09-21 DIAGNOSIS — F17200 Nicotine dependence, unspecified, uncomplicated: Secondary | ICD-10-CM | POA: Insufficient documentation

## 2017-09-21 HISTORY — DX: Dyspnea, unspecified: R06.00

## 2017-09-21 HISTORY — DX: Fibromyalgia: M79.7

## 2017-09-21 HISTORY — PX: COLONOSCOPY WITH PROPOFOL: SHX5780

## 2017-09-21 HISTORY — DX: Unspecified osteoarthritis, unspecified site: M19.90

## 2017-09-21 HISTORY — DX: Gastro-esophageal reflux disease without esophagitis: K21.9

## 2017-09-21 HISTORY — PX: ESOPHAGOGASTRODUODENOSCOPY (EGD) WITH PROPOFOL: SHX5813

## 2017-09-21 LAB — POCT PREGNANCY, URINE: PREG TEST UR: NEGATIVE

## 2017-09-21 SURGERY — ESOPHAGOGASTRODUODENOSCOPY (EGD) WITH PROPOFOL
Anesthesia: General

## 2017-09-21 MED ORDER — MIDAZOLAM HCL 2 MG/2ML IJ SOLN
INTRAMUSCULAR | Status: DC | PRN
  Administered 2017-09-21: 2 mg via INTRAVENOUS

## 2017-09-21 MED ORDER — PROPOFOL 500 MG/50ML IV EMUL
INTRAVENOUS | Status: DC | PRN
  Administered 2017-09-21: 150 ug/kg/min via INTRAVENOUS

## 2017-09-21 MED ORDER — MIDAZOLAM HCL 2 MG/2ML IJ SOLN
INTRAMUSCULAR | Status: AC
  Filled 2017-09-21: qty 2

## 2017-09-21 MED ORDER — PROPOFOL 10 MG/ML IV BOLUS
INTRAVENOUS | Status: DC | PRN
  Administered 2017-09-21 (×6): 20 mg via INTRAVENOUS

## 2017-09-21 MED ORDER — SODIUM CHLORIDE 0.9 % IV SOLN
INTRAVENOUS | Status: DC
  Administered 2017-09-21: 1000 mL via INTRAVENOUS

## 2017-09-21 NOTE — H&P (Signed)
Jonathon Bellows, MD [address], [place], [place], [place], [address] [id number], [place], [place], [place], [address] Phone: [phone]  Fax: [phone]  Primary Care Physician:  Cletis Athens, MD   Pre-Procedure History & Physical: HPI:  Andrea Frazier is a 47 y.o. female is here for an endoscopy and colonoscopy    History reviewed. No pertinent past medical history.  Past Surgical History:  Procedure Laterality Date  . DILATION AND CURETTAGE OF UTERUS      Prior to Admission medications   Medication Sig Start Date End Date Taking? Authorizing Provider  acetaminophen (TYLENOL) 325 MG tablet Take 650 mg by mouth every 6 (six) hours as needed.   Yes [provider]  gabapentin (NEURONTIN) 600 MG tablet Take by mouth.   Yes [provider]  HYDROcodone-acetaminophen (NORCO) 7.5-325 MG tablet Take 1 tablet by mouth every 6 (six) hours as needed for moderate pain.   Yes [provider]  traMADol (ULTRAM) 50 MG tablet Take by mouth every 6 (six) hours as needed.   Yes [provider]  clonazePAM (KLONOPIN) 0.5 MG tablet Take 0.5 mg by mouth 2 (two) times daily as needed for anxiety.    [provider]  esomeprazole (NEXIUM) 40 MG capsule Take 40 mg by mouth daily at 12 noon.    [provider]  rosuvastatin (CRESTOR) 20 MG tablet  03/07/16   [provider]    Allergies as of 09/14/2017 - Review Complete 09/14/2017  Allergen Reaction Noted  . Sulfa antibiotics Shortness Of Breath 09/14/2013    Family History  Problem Relation Age of Onset  . Rheum arthritis Sister   . Ovarian cancer Sister   . Cancer Maternal Aunt     Social History   Socioeconomic History  . Marital status: Married    Spouse name: Not on file  . Number of children: Not on file  . Years of education: Not on file  . Highest education level: Not on file  Occupational History  . Not on file  Social Needs  . Financial resource  strain: Not on file  . Food insecurity:    Worry: Not on file    Inability: Not on file  . Transportation needs:    Medical: Not on file    Non-medical: Not on file  Tobacco Use  . Smoking status: Current Every Day Smoker  . Smokeless tobacco: Never Used  Substance and Sexual Activity  . Alcohol use: No  . Drug use: No  . Sexual activity: Yes  Lifestyle  . Physical activity:    Days per week: Not on file    Minutes per session: Not on file  . Stress: Not on file  Relationships  . Social connections:    Talks on phone: Not on file    Gets together: Not on file    Attends religious service: Not on file    Active member of club or organization: Not on file    Attends meetings of clubs or organizations: Not on file    Relationship status: Not on file  . Intimate partner violence:    Fear of current or ex partner: Not on file    Emotionally abused: Not on file    Physically abused: Not on file    Forced sexual activity: Not on file  Other Topics Concern  . Not on file  Social History Narrative  . Not on file    Review of Systems: See  HPI, otherwise negative ROS  Physical Exam: There were no vitals taken for this visit. General:   Alert,  pleasant and cooperative in NAD Head:  Normocephalic and atraumatic. Neck:  Supple; no masses or thyromegaly. Lungs:  Clear throughout to auscultation, normal respiratory effort.    Heart:  +S1, +S2, Regular rate and rhythm, No edema. Abdomen:  Soft, nontender and nondistended. Normal bowel sounds, without guarding, and without rebound.   Neurologic:  Alert and  oriented x4;  grossly normal neurologically.  Impression/Plan: Andrea Frazier is here for an endoscopy and colonoscopy  to be performed for  evaluation of GERD and colon cancer screening     Risks, benefits, limitations, and alternatives regarding endoscopy have been reviewed with the patient.  Questions have been answered.  All parties agreeable.   Jonathon Bellows, MD   09/21/2017, 8:06 AM

## 2017-09-21 NOTE — Op Note (Signed)
[facility] Patient Name: Andrea Frazier Procedure Date: 09/21/2017 8:39 AM MRN: [id number] Account #: [id number] Date of Birth: 06-30-1970 Admit Type: Outpatient Age: 47 Room: [facility] ENDO ROOM 1 Gender: Female Note Status: Finalized Procedure:            Colonoscopy Indications:          Screening in patient at increased risk: Family history                        of 1st-degree relative with colorectal cancer Providers:            Jonathon Bellows MD, MD Referring MD:         Cletis Athens, MD (Referring MD) Medicines:            Monitored Anesthesia Care Complications:        No immediate complications. Procedure:            Pre-Anesthesia Assessment:                       - Prior to the procedure, a History and Physical was                        performed, and patient medications, allergies and                        sensitivities were reviewed. The patient's tolerance of                        previous anesthesia was reviewed.                       - The risks and benefits of the procedure and the                        sedation options and risks were discussed with the                        patient. All questions were answered and informed                        consent was obtained.                       - ASA Grade Assessment: III - A patient with severe                        systemic disease.                       After obtaining informed consent, the colonoscope was                        passed under direct vision. Throughout the procedure,                        the patient's blood pressure, pulse, and oxygen                        saturations were monitored continuously. The  Colonoscope was introduced through the anus and                        advanced to the the cecum, identified by the                        appendiceal orifice, IC valve and transillumination.                        The colonoscopy was performed  with ease. The patient                        tolerated the procedure well. The quality of the bowel                        preparation was poor. Findings:      The perianal and digital rectal examinations were normal.      The entire examined colon appeared normal on direct and retroflexion       views. Impression:           - Preparation of the colon was poor.                       - The entire examined colon is normal on direct and                        retroflexion views.                       - No specimens collected. Recommendation:       - Discharge patient to home (with escort).                       - Resume previous diet.                       - Continue present medications.                       - Repeat colonoscopy in 4 months because the bowel                        preparation was suboptimal.                       - Return to GI office as previously scheduled. Procedure Code(s):    --- Professional ---                       [phone], Colonoscopy, flexible; diagnostic, including                        collection of specimen(s) by brushing or washing, when                        performed (separate procedure) Diagnosis Code(s):    --- Professional ---                       Z80.0, Family history of malignant neoplasm of                        digestive  organs CPT copyright 2017 American Medical Association. All rights reserved. The codes documented in this report are preliminary and upon coder review may  be revised to meet current compliance requirements. Jonathon Bellows, MD Jonathon Bellows MD, MD 09/21/2017 9:03:49 AM This report has been signed electronically. Number of Addenda: 0 Note Initiated On: 09/21/2017 8:39 AM Scope Withdrawal Time: 0 hours 6 minutes 16 seconds  Total Procedure Duration: 0 hours 11 minutes 10 seconds       [facility]

## 2017-09-21 NOTE — Anesthesia Preprocedure Evaluation (Signed)
Anesthesia Evaluation  Patient identified by MRN, date of birth, ID band Patient awake    Reviewed: Allergy & Precautions, NPO status , Patient's Chart, lab work & pertinent test results, reviewed documented beta blocker date and time   Airway Mallampati: III  TM Distance: >3 FB     Dental  (+) Chipped   Pulmonary Current Smoker,           Cardiovascular + Valvular Problems/Murmurs      Neuro/Psych    GI/Hepatic   Endo/Other    Renal/GU      Musculoskeletal   Abdominal   Peds  Hematology   Anesthesia Other Findings   Reproductive/Obstetrics                             Anesthesia Physical Anesthesia Plan  ASA: III  Anesthesia Plan: General   Post-op Pain Management:    Induction: Intravenous  PONV Risk Score and Plan:   Airway Management Planned:   Additional Equipment:   Intra-op Plan:   Post-operative Plan:   Informed Consent: I have reviewed the patients History and Physical, chart, labs and discussed the procedure including the risks, benefits and alternatives for the proposed anesthesia with the patient or authorized representative who has indicated his/her understanding and acceptance.     Plan Discussed with: CRNA  Anesthesia Plan Comments:         Anesthesia Quick Evaluation

## 2017-09-21 NOTE — Anesthesia Postprocedure Evaluation (Signed)
Anesthesia Post Note  Patient: Andrea Frazier  Procedure(s) Performed: ESOPHAGOGASTRODUODENOSCOPY (EGD) WITH PROPOFOL (N/A ) COLONOSCOPY WITH PROPOFOL (N/A )  Patient location during evaluation: Endoscopy Anesthesia Type: General Level of consciousness: awake and alert Pain management: pain level controlled Vital Signs Assessment: post-procedure vital signs reviewed and stable Respiratory status: spontaneous breathing, nonlabored ventilation, respiratory function stable and patient connected to nasal cannula oxygen Cardiovascular status: blood pressure returned to baseline and stable Postop Assessment: no apparent nausea or vomiting Anesthetic complications: no     Last Vitals:  Vitals:   09/21/17 0905 09/21/17 0915  BP: (!) 96/58 111/64  Pulse: 88   Resp: 16   Temp: (!) 36.2 C   SpO2:      Last Pain:  Vitals:   09/21/17 0935  TempSrc:   PainSc: 0-No pain                 Tandy Grawe S

## 2017-09-21 NOTE — Transfer of Care (Signed)
Immediate Anesthesia Transfer of Care Note  Patient: Andrea Frazier  Procedure(s) Performed: ESOPHAGOGASTRODUODENOSCOPY (EGD) WITH PROPOFOL (N/A ) COLONOSCOPY WITH PROPOFOL (N/A )  Patient Location: PACU  Anesthesia Type:MAC  Level of Consciousness: sedated  Airway & Oxygen Therapy: Patient Spontanous Breathing  Post-op Assessment: Report given to RN  Post vital signs: stable  Last Vitals:  Vitals Value Taken Time  BP    Temp    Pulse    Resp    SpO2      Last Pain:  Vitals:   09/21/17 0811  TempSrc: Tympanic  PainSc: 9          Complications: No apparent anesthesia complications

## 2017-09-21 NOTE — Anesthesia Post-op Follow-up Note (Signed)
Anesthesia QCDR form completed.        

## 2017-09-21 NOTE — Op Note (Signed)
[facility] Patient Name: Andrea Frazier Procedure Date: 09/21/2017 8:39 AM MRN: [id number] Account #: [id number] Date of Birth: 24-Dec-1970 Admit Type: Outpatient Age: 47 Room: [facility] ENDO ROOM 1 Gender: Female Note Status: Finalized Procedure:            Upper GI endoscopy Indications:          Heartburn Providers:            Jonathon Bellows MD, MD Referring MD:         Cletis Athens, MD (Referring MD) Medicines:            Monitored Anesthesia Care Complications:        No immediate complications. Procedure:            Pre-Anesthesia Assessment:                       - Prior to the procedure, a History and Physical was                        performed, and patient medications, allergies and                        sensitivities were reviewed. The patient's tolerance of                        previous anesthesia was reviewed.                       - The risks and benefits of the procedure and the                        sedation options and risks were discussed with the                        patient. All questions were answered and informed                        consent was obtained.                       - After reviewing the risks and benefits, the patient                        was deemed in satisfactory condition to undergo the                        procedure.                       - ASA Grade Assessment: III - A patient with severe                        systemic disease.                       After obtaining informed consent, the endoscope was                        passed under direct vision. Throughout the procedure,                        the patient's blood  pressure, pulse, and oxygen                        saturations were monitored continuously. The Endoscope                        was introduced through the mouth, and advanced to the                        third part of duodenum. The upper GI endoscopy was   accomplished with ease. The patient tolerated the                        procedure well. Findings:      The esophagus was normal.      The stomach was normal.      The examined duodenum was normal. Impression:           - Normal esophagus.                       - Normal stomach.                       - Normal examined duodenum.                       - No specimens collected. Recommendation:       - Perform a colonoscopy today. Procedure Code(s):    --- Professional ---                       [id number], Esophagogastroduodenoscopy, flexible, transoral;                        diagnostic, including collection of specimen(s) by                        brushing or washing, when performed (separate procedure) Diagnosis Code(s):    --- Professional ---                       R12, Heartburn CPT copyright 2017 American Medical Association. All rights reserved. The codes documented in this report are preliminary and upon coder review may  be revised to meet current compliance requirements. Jonathon Bellows, MD Jonathon Bellows MD, MD 09/21/2017 8:48:37 AM This report has been signed electronically. Number of Addenda: 0 Note Initiated On: 09/21/2017 8:39 AM      [facility]

## 2017-09-23 ENCOUNTER — Encounter: Payer: Self-pay | Admitting: Gastroenterology

## 2017-09-27 DIAGNOSIS — R69 Illness, unspecified: Secondary | ICD-10-CM | POA: Diagnosis not present

## 2017-10-13 DIAGNOSIS — Z8269 Family history of other diseases of the musculoskeletal system and connective tissue: Secondary | ICD-10-CM | POA: Diagnosis not present

## 2017-10-13 DIAGNOSIS — R5382 Chronic fatigue, unspecified: Secondary | ICD-10-CM | POA: Diagnosis not present

## 2017-10-13 DIAGNOSIS — M5126 Other intervertebral disc displacement, lumbar region: Secondary | ICD-10-CM | POA: Diagnosis not present

## 2017-10-13 DIAGNOSIS — N319 Neuromuscular dysfunction of bladder, unspecified: Secondary | ICD-10-CM | POA: Diagnosis not present

## 2017-10-13 DIAGNOSIS — D259 Leiomyoma of uterus, unspecified: Secondary | ICD-10-CM | POA: Diagnosis not present

## 2017-10-13 DIAGNOSIS — M797 Fibromyalgia: Secondary | ICD-10-CM | POA: Diagnosis not present

## 2017-10-26 ENCOUNTER — Ambulatory Visit: Payer: Self-pay | Admitting: Gastroenterology

## 2017-11-12 DIAGNOSIS — D259 Leiomyoma of uterus, unspecified: Secondary | ICD-10-CM | POA: Diagnosis not present

## 2017-11-12 DIAGNOSIS — M797 Fibromyalgia: Secondary | ICD-10-CM | POA: Diagnosis not present

## 2017-11-12 DIAGNOSIS — R5382 Chronic fatigue, unspecified: Secondary | ICD-10-CM | POA: Diagnosis not present

## 2017-11-12 DIAGNOSIS — Z882 Allergy status to sulfonamides status: Secondary | ICD-10-CM | POA: Diagnosis not present

## 2017-12-20 DIAGNOSIS — D259 Leiomyoma of uterus, unspecified: Secondary | ICD-10-CM | POA: Diagnosis not present

## 2017-12-20 DIAGNOSIS — Z8269 Family history of other diseases of the musculoskeletal system and connective tissue: Secondary | ICD-10-CM | POA: Diagnosis not present

## 2017-12-20 DIAGNOSIS — M797 Fibromyalgia: Secondary | ICD-10-CM | POA: Diagnosis not present

## 2017-12-20 DIAGNOSIS — R5382 Chronic fatigue, unspecified: Secondary | ICD-10-CM | POA: Diagnosis not present

## 2018-01-07 IMAGING — US US ABDOMEN LIMITED
1 series · 14 of 25 positions shown · non-contrast
Comparison: CT 12/14/2012

CLINICAL DATA: Right upper quadrant abdominal pain

EXAM:
ULTRASOUND ABDOMEN LIMITED RIGHT UPPER QUADRANT

[Series 1: us abdomen limited · 0.20mm/px · 14 of 56 slices shown]
[im 1/56]
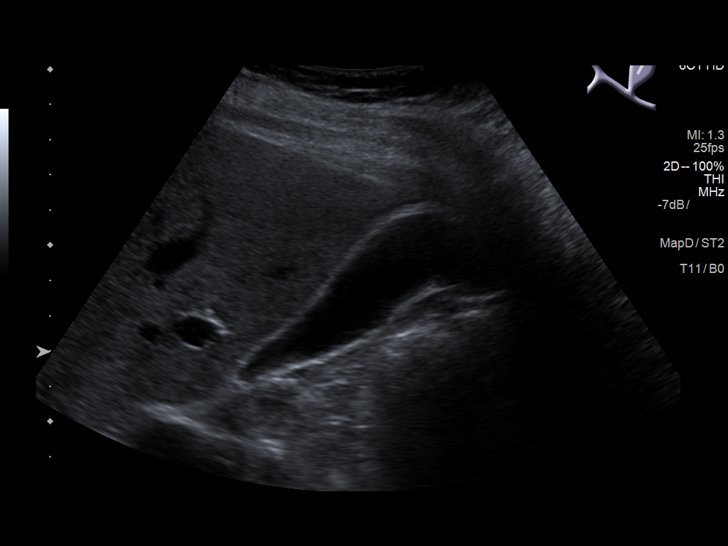
[im 5/56]
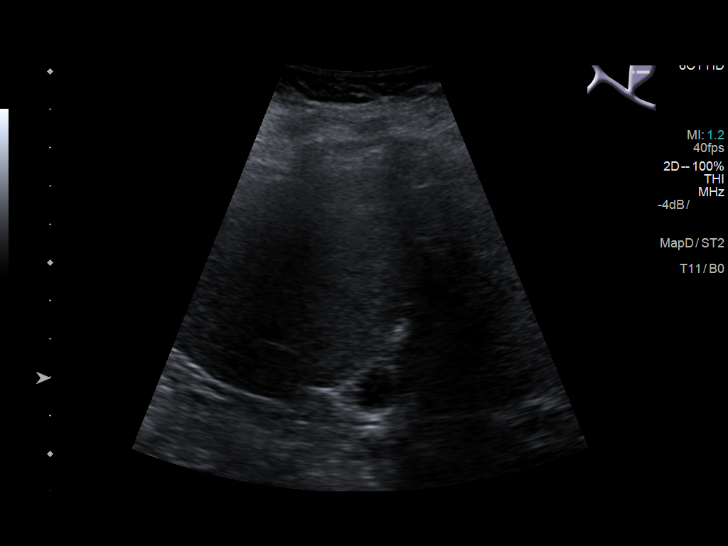
[im 10/56]
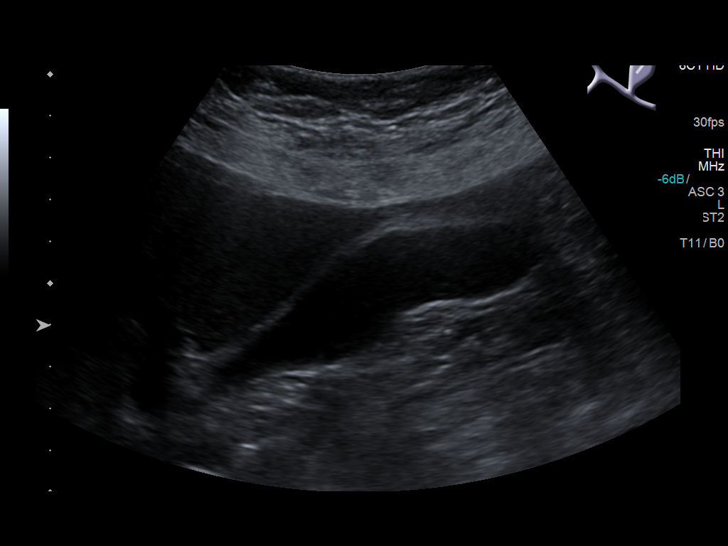
[im 14/56]
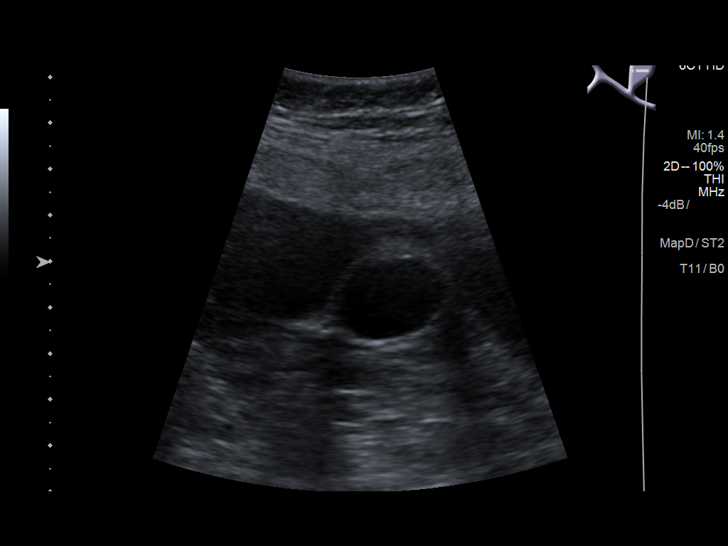
[im 19/56]
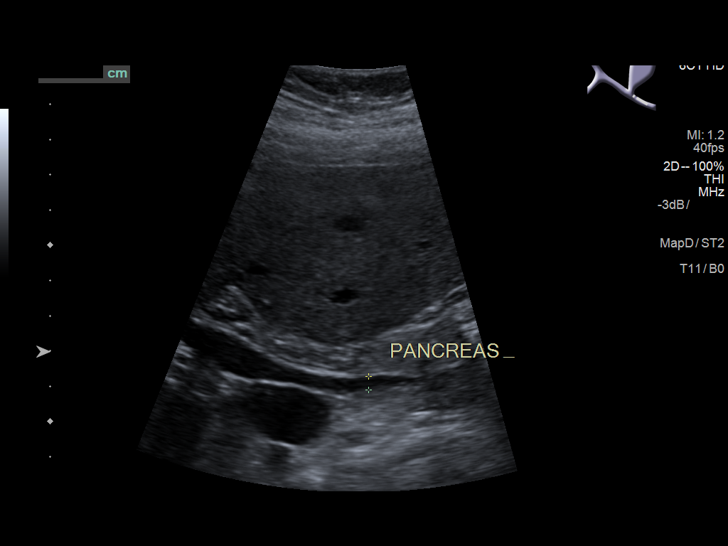
[im 21/56]
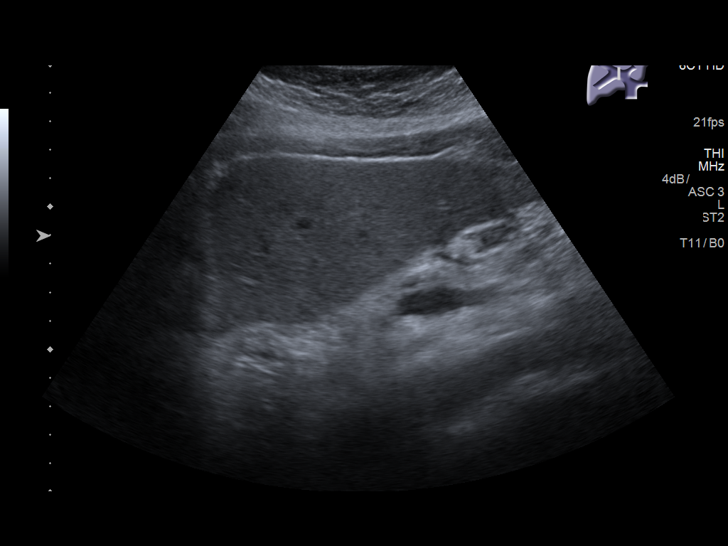
[im 26/56]
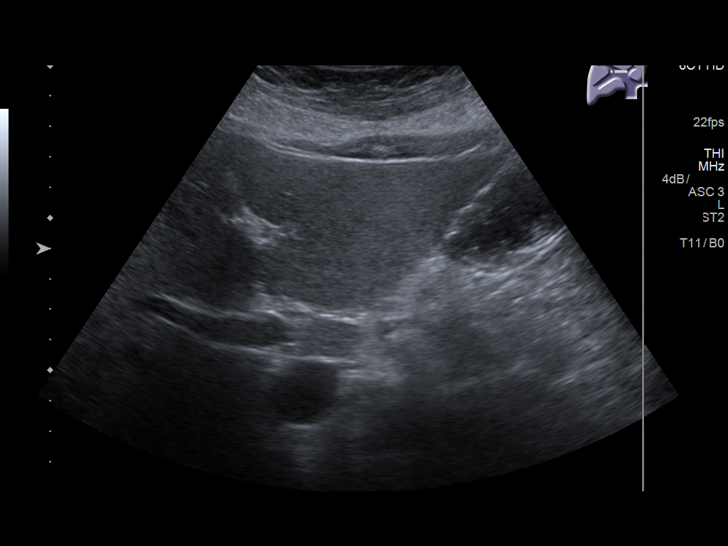
[im 30/56]
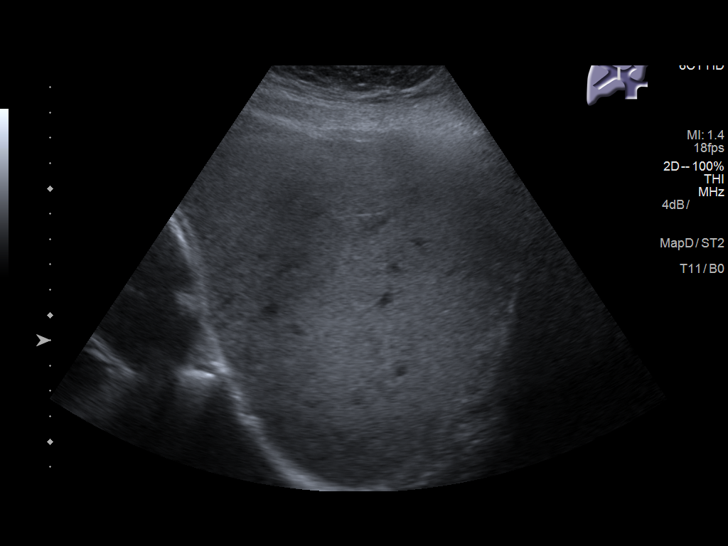
[im 35/56]
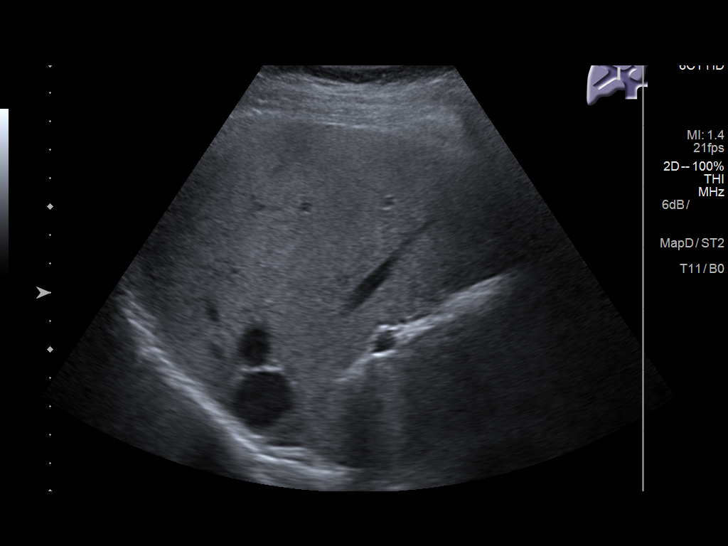
[im 37/56]
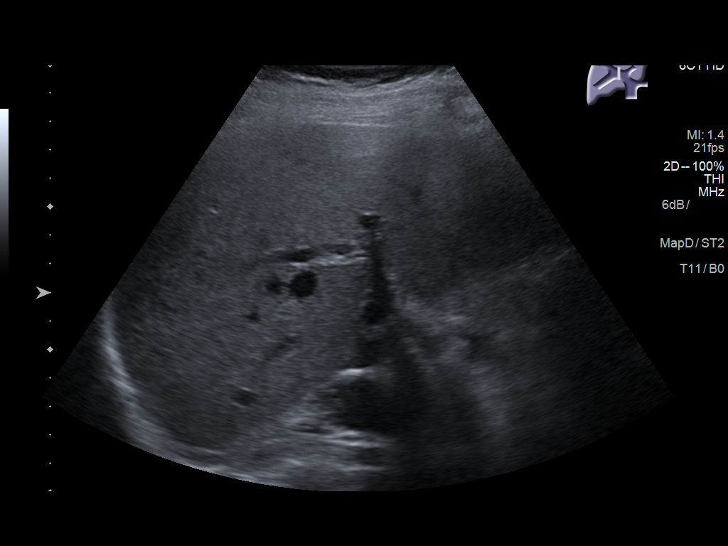
[im 42/56]
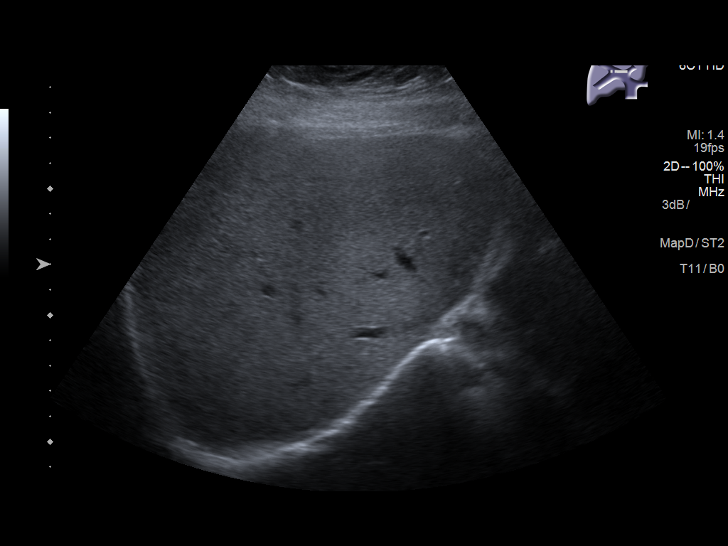
[im 46/56]
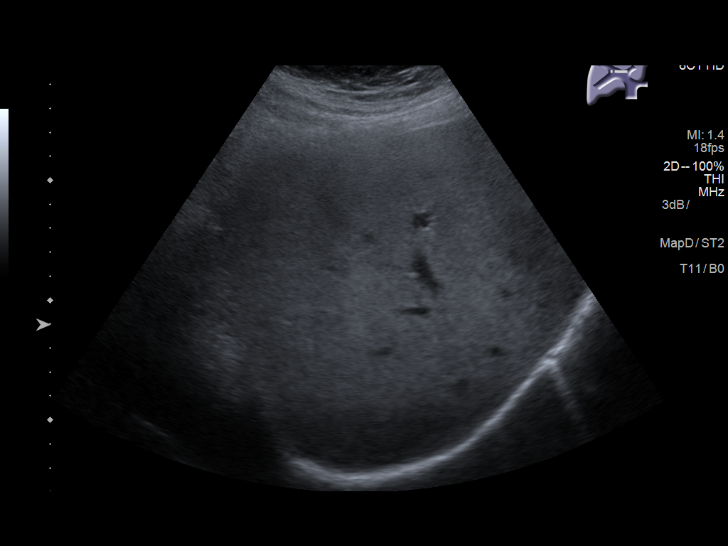
[im 51/56]
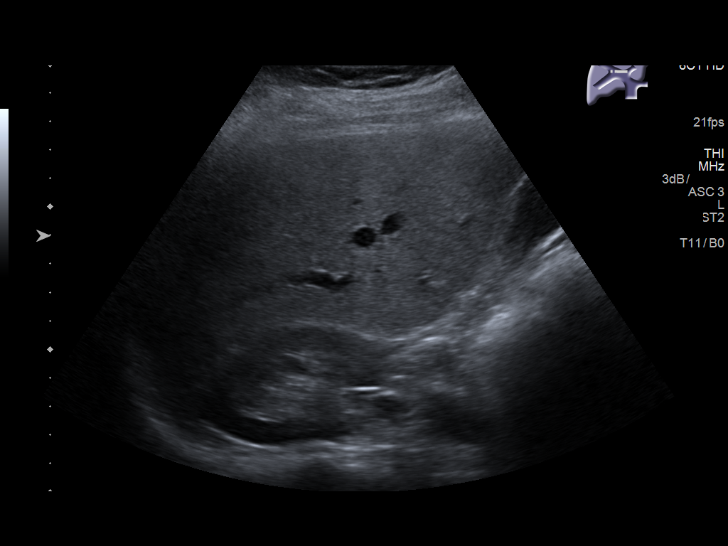
[im 56/56]
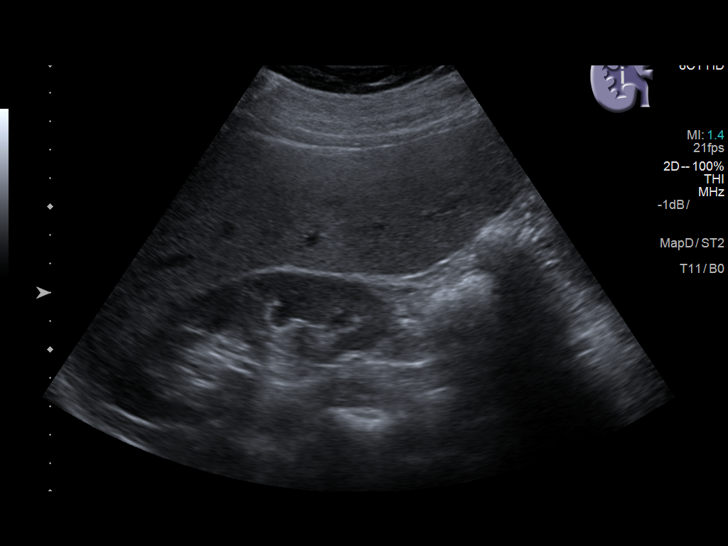

[14 of 25 positions shown; findings below may reference images not displayed]

FINDINGS: Gallbladder:

No gallstones or wall thickening visualized. No sonographic Murphy
sign noted by sonographer.

Common bile duct:

Diameter: Common bile duct is dilated proximally measuring 10 mm,
tapering to 3 mm in the pancreatic head region.

Liver:

Increased echotexture compatible with fatty infiltration. No focal
abnormality or biliary ductal dilatation.
IMPRESSION: Fatty infiltration of the liver.

Mild dilatation of the common bile duct proximally, but this tapers
to normal caliber distally without visible ductal stone. This is
stable since 7159 CT and likely is within normal limits.

## 2018-01-19 DIAGNOSIS — M5126 Other intervertebral disc displacement, lumbar region: Secondary | ICD-10-CM | POA: Diagnosis not present

## 2018-01-19 DIAGNOSIS — R5382 Chronic fatigue, unspecified: Secondary | ICD-10-CM | POA: Diagnosis not present

## 2018-01-19 DIAGNOSIS — Z8269 Family history of other diseases of the musculoskeletal system and connective tissue: Secondary | ICD-10-CM | POA: Diagnosis not present

## 2018-01-19 DIAGNOSIS — Z882 Allergy status to sulfonamides status: Secondary | ICD-10-CM | POA: Diagnosis not present

## 2018-02-21 DIAGNOSIS — M5126 Other intervertebral disc displacement, lumbar region: Secondary | ICD-10-CM | POA: Diagnosis not present

## 2018-02-21 DIAGNOSIS — Z882 Allergy status to sulfonamides status: Secondary | ICD-10-CM | POA: Diagnosis not present

## 2018-02-21 DIAGNOSIS — N319 Neuromuscular dysfunction of bladder, unspecified: Secondary | ICD-10-CM | POA: Diagnosis not present

## 2018-02-21 DIAGNOSIS — Z8269 Family history of other diseases of the musculoskeletal system and connective tissue: Secondary | ICD-10-CM | POA: Diagnosis not present

## 2018-03-25 DIAGNOSIS — Z8269 Family history of other diseases of the musculoskeletal system and connective tissue: Secondary | ICD-10-CM | POA: Diagnosis not present

## 2018-03-25 DIAGNOSIS — M797 Fibromyalgia: Secondary | ICD-10-CM | POA: Diagnosis not present

## 2018-03-25 DIAGNOSIS — R5382 Chronic fatigue, unspecified: Secondary | ICD-10-CM | POA: Diagnosis not present

## 2018-03-25 DIAGNOSIS — M5126 Other intervertebral disc displacement, lumbar region: Secondary | ICD-10-CM | POA: Diagnosis not present

## 2018-04-25 DIAGNOSIS — M545 Low back pain: Secondary | ICD-10-CM | POA: Diagnosis not present

## 2018-04-25 DIAGNOSIS — M5126 Other intervertebral disc displacement, lumbar region: Secondary | ICD-10-CM | POA: Diagnosis not present

## 2018-04-25 DIAGNOSIS — Z0000 Encounter for general adult medical examination without abnormal findings: Secondary | ICD-10-CM | POA: Diagnosis not present

## 2018-04-25 DIAGNOSIS — R5382 Chronic fatigue, unspecified: Secondary | ICD-10-CM | POA: Diagnosis not present

## 2018-04-25 DIAGNOSIS — M76899 Other specified enthesopathies of unspecified lower limb, excluding foot: Secondary | ICD-10-CM | POA: Diagnosis not present

## 2018-05-27 DIAGNOSIS — M76899 Other specified enthesopathies of unspecified lower limb, excluding foot: Secondary | ICD-10-CM | POA: Diagnosis not present

## 2018-05-27 DIAGNOSIS — M545 Low back pain: Secondary | ICD-10-CM | POA: Diagnosis not present

## 2018-05-27 DIAGNOSIS — M5126 Other intervertebral disc displacement, lumbar region: Secondary | ICD-10-CM | POA: Diagnosis not present

## 2018-05-27 DIAGNOSIS — Z8269 Family history of other diseases of the musculoskeletal system and connective tissue: Secondary | ICD-10-CM | POA: Diagnosis not present

## 2018-07-12 ENCOUNTER — Ambulatory Visit
Payer: Medicare HMO | Attending: Student in an Organized Health Care Education/Training Program | Admitting: Student in an Organized Health Care Education/Training Program

## 2018-07-12 ENCOUNTER — Other Ambulatory Visit: Payer: Self-pay

## 2018-07-12 ENCOUNTER — Encounter: Payer: Self-pay | Admitting: Student in an Organized Health Care Education/Training Program

## 2018-07-12 VITALS — BP 143/105 | HR 92 | Temp 98.4°F | Resp 18 | Ht 62.0 in | Wt 186.0 lb

## 2018-07-12 DIAGNOSIS — M797 Fibromyalgia: Secondary | ICD-10-CM | POA: Insufficient documentation

## 2018-07-12 DIAGNOSIS — M545 Low back pain: Secondary | ICD-10-CM

## 2018-07-12 DIAGNOSIS — M546 Pain in thoracic spine: Secondary | ICD-10-CM | POA: Diagnosis not present

## 2018-07-12 DIAGNOSIS — G8929 Other chronic pain: Secondary | ICD-10-CM | POA: Diagnosis not present

## 2018-07-12 DIAGNOSIS — G894 Chronic pain syndrome: Secondary | ICD-10-CM | POA: Diagnosis not present

## 2018-07-12 DIAGNOSIS — K089 Disorder of teeth and supporting structures, unspecified: Secondary | ICD-10-CM | POA: Diagnosis not present

## 2018-07-12 NOTE — Progress Notes (Signed)
Safety precautions to be maintained throughout the outpatient stay will include: orient to surroundings, keep bed in low position, maintain call bell within reach at all times, provide assistance with transfer out of bed and ambulation.  

## 2018-07-12 NOTE — Progress Notes (Signed)
Patient's Name: Andrea Frazier  MRN: [id number]  Referring Provider: Cletis Athens, MD  DOB: Jun 30, 1970  PCP: Cletis Athens, MD  DOS: 07/12/2018  Note by: Gillis Santa, MD  Service setting: Ambulatory outpatient  Specialty: Interventional Pain Management  Location: [facility] (AMB) Pain Management Facility  Visit type: Initial Patient Evaluation  Patient type: New Patient   Primary Reason(s) for Visit: Encounter for initial evaluation of one or more chronic problems (new to examiner) potentially causing chronic pain, and posing a threat to normal musculoskeletal function. (Level of risk: High) CC: Back Pain (mid to low) and Arm Pain  HPI  Andrea Frazier is a 48 y.o. year old, female patient, who comes today to see [place] for the first time for an initial evaluation of her chronic pain. She has Heart murmur; Chronic pelvic pain in female; Fibromyalgia; Chronic dental pain; Chronic pain syndrome; Thoracic spine pain; and Chronic bilateral low back pain without sciatica on their problem list. Today she comes in for evaluation of her Back Pain (mid to low) and Arm Pain  Pain Assessment: Location: Mid, Lower Back Radiating: radiates down side of both legs to knees Onset: More than a month ago Duration: Chronic pain Quality: Aching Severity: 8 /10 (subjective, self-reported pain score)  Note: Reported level is inconsistent with clinical observations. Clinically the patient looks like a 3/10 A 3/10 is viewed as "Moderate" and described as significantly interfering with activities of daily living (ADL). It becomes difficult to feed, bathe, get dressed, get on and off the toilet or to perform personal hygiene functions. Difficult to get in and out of bed or a chair without assistance. Very distracting. With effort, it can be ignored when deeply involved in activities. Information on the proper use of the pain scale provided to the patient today. When using our objective Pain Scale, levels between 6 and 10/10 are  said to belong in an emergency room, as it progressively worsens from a 6/10, described as severely limiting, requiring emergency care not usually available at an outpatient pain management facility. At a 6/10 level, communication becomes difficult and requires great effort. Assistance to reach the emergency department may be required. Facial flushing and profuse sweating along with potentially dangerous increases in heart rate and blood pressure will be evident. Effect on ADL: Limits activities Timing: Constant Modifying factors: medication BP: (!) 143/105  HR: 92  Onset and Duration: Gradual, Date of onset: 2009 and Present longer than 3 months Cause of pain: none listed Severity: Getting better, NAS-11 at its worse: 9/10 and NAS-11 now: 8/10 Timing: Not influenced by the time of the day Aggravating Factors: Bending, Bowel movements, Eating, Intercourse (sex), Kneeling, Motion, Prolonged sitting, Prolonged standing, Squatting, Stooping , Surgery made it worse, Twisting and Walking Alleviating Factors: Medications Associated Problems: Constipation, Fatigue, Inability to concentrate, Inability to control bladder (urine), Spasms, Sweating, Swelling, Tingling, Weakness and Pain that does not allow patient to sleep Quality of Pain: Agonizing, Annoying, Constant, Intermittent, Cruel, Disabling, Distressing, Dreadful, Exhausting, Feeling of constriction, Feeling of weight, Horrible, Itching, Lancinating, Nagging, Numb, Pressure-like, Punishing, Sharp, Sickening, Stabbing, Tiring and Uncomfortable Previous Examinations or Tests: The patient denies treatments Previous Treatments: Narcotic medications, Physical Therapy, Steroid treatments by mouth and Strengthening exercises  The patient comes into the clinics today for the first time for a chronic pain management evaluation.   Patient follows up today with a chief complaint of mid thoracic and low back pain.  Patient states that this pain is been  present for over 7  years.  It is chronic in nature.  She also has a diagnosis of fibromyalgia.  Patient is on tramadol 100 mg 3 times daily as needed, quantity 180/month MME equals 30.  She also has fills of hydrocodone 7.5 mg quantity 30 and quantity 15 over the last 2 months.  She also endorses chronic dental pain and states that her dental pain is linked to her whole body pain.  Patient is primarily here interested in opioid medications.  She states that she is not able to do much and sits at home most of the day because of her diffuse whole body pain.  Patient has tried physical therapy in the past but states that she was unable to tolerate given discomfort and increased pain.  Patient is tried and failed Lyrica and Cymbalta.  She is currently on gabapentin.  Today I took the time to provide the patient with information regarding my pain practice. The patient was informed that my practice is divided into two sections: an interventional pain management section, as well as a completely separate and distinct medication management section. I explained that I have procedure days for my interventional therapies, and evaluation days for follow-ups and medication management. Because of the amount of documentation required during both, they are kept separated. This means that there is the possibility that she may be scheduled for a procedure on one day, and medication management the next. I have also informed her that because of staffing and facility limitations, I no longer take patients for medication management only. To illustrate the reasons for this, I gave the patient the example of surgeons, and how inappropriate it would be to refer a patient to his/her care, just to write for the post-surgical antibiotics on a surgery done by a different surgeon.   Because interventional pain management is my board-certified specialty, the patient was informed that joining my practice means that they are open to any and all  interventional therapies. I made it clear that this does not mean that they will be forced to have any procedures done. What this means is that I believe interventional therapies to be essential part of the diagnosis and proper management of chronic pain conditions. Therefore, patients not interested in these interventional alternatives will be better served under the care of a different practitioner.  The patient was also made aware of my Comprehensive Pain Management Safety Guidelines where by joining my practice, they limit all of their nerve blocks and joint injections to those done by our practice, for as long as we are retained to manage their care.   Historic Controlled Substance Pharmacotherapy Review   06/07/2018  1   05/27/2018  Tramadol Hcl 50 MG Tablet  180.00 30 Ja Mas   [id number]   078 ([id number])   0  30.00 MME  Comm Ins   [place]  06/03/2018  1   05/27/2018  Hydrocodone-Acetamin 7.5-325  15.00 5 Ja Mas   [id number]   [place] ([id number])   0  22.50 MME  Medicare   [place]    Medications: The patient did not bring the medication(s) to the appointment, as requested in our "New Patient Package" Pharmacodynamics: Desired effects: Analgesia: The patient reports >50% benefit. Reported improvement in function: The patient reports medication allows her to accomplish basic ADLs. Clinically meaningful improvement in function (CMIF): Sustained CMIF goals met Perceived effectiveness: Described as relatively effective, allowing for increase in activities of daily living (ADL) Undesirable effects: Side-effects or Adverse reactions: None reported Historical Monitoring: The  patient  reports no history of drug use. List of all UDS Test(s): No results found for: MDMA, COCAINSCRNUR, [place], [place], CANNABQUANT, [place], [place] List of other Serum/Urine Drug Screening Test(s):  No results found for: AMPHSCRSER, BARBSCRSER, BENZOSCRSER, COCAINSCRSER, COCAINSCRNUR, PCPSCRSER, PCPQUANT, THCSCRSER, THCU, CANNABQUANT,  OPIATESCRSER, OXYSCRSER, PROPOXSCRSER, ETH Historical Background Evaluation: [place] PMP: Six (6) year initial data search conducted.             [place] Department of public safety, offender search: Editor, commissioning Information) Non-contributory Risk Assessment Profile: Aberrant behavior: None observed or detected today Risk factors for fatal opioid overdose: None identified today Fatal overdose hazard ratio (HR): Calculation deferred Non-fatal overdose hazard ratio (HR): Calculation deferred Risk of opioid abuse or dependence: 0.7-3.0% with doses ? 36 MME/day and 6.1-26% with doses ? 120 MME/day. Substance use disorder (SUD) risk level: See below Personal History of Substance Abuse (SUD-Substance use disorder):  Alcohol:    Illegal Drugs:    Rx Drugs:    ORT Risk Level calculation:    ORT Scoring interpretation table:  Score <3 = Low Risk for SUD  Score between 4-7 = Moderate Risk for SUD  Score >8 = High Risk for Opioid Abuse   PHQ-2 Depression Scale:  Total score: 0  PHQ-2 Scoring interpretation table: (Score and probability of major depressive disorder)  Score 0 = No depression  Score 1 = 15.4% Probability  Score 2 = 21.1% Probability  Score 3 = 38.4% Probability  Score 4 = 45.5% Probability  Score 5 = 56.4% Probability  Score 6 = 78.6% Probability   PHQ-9 Depression Scale:  Total score: 0  PHQ-9 Scoring interpretation table:  Score 0-4 = No depression  Score 5-9 = Mild depression  Score 10-14 = Moderate depression  Score 15-19 = Moderately severe depression  Score 20-27 = Severe depression (2.4 times higher risk of SUD and 2.89 times higher risk of overuse)   Pharmacologic Plan: As per protocol, I have not taken over any controlled substance management, pending the results of ordered tests and/or consults.            Initial impression: Pending review of available data and ordered tests.  Meds   Current Outpatient Medications:  .  Aspirin-Salicylamide-Caffeine (BC HEADACHE POWDER  PO), Take 1 Dose by mouth 3 (three) times daily as needed., Disp: , Rfl:  .  diclofenac (VOLTAREN) 50 MG EC tablet, Take 50 mg by mouth 2 (two) times daily., Disp: , Rfl:  .  esomeprazole (NEXIUM) 40 MG capsule, Take 40 mg by mouth daily at 12 noon., Disp: , Rfl:  .  gabapentin (NEURONTIN) 600 MG tablet, Take by mouth., Disp: , Rfl:  .  rosuvastatin (CRESTOR) 20 MG tablet, , Disp: , Rfl:  .  traMADol (ULTRAM) 50 MG tablet, Take by mouth every 6 (six) hours as needed., Disp: , Rfl:  .  acetaminophen (TYLENOL) 325 MG tablet, Take 650 mg by mouth every 6 (six) hours as needed., Disp: , Rfl:  .  clonazePAM (KLONOPIN) 0.5 MG tablet, Take 0.5 mg by mouth 2 (two) times daily as needed for anxiety., Disp: , Rfl:  .  HYDROcodone-acetaminophen (NORCO) 7.5-325 MG tablet, Take 1 tablet by mouth every 6 (six) hours as needed for moderate pain., Disp: , Rfl:    ROS  Cardiovascular: High blood pressure Pulmonary or Respiratory: Smoking, Snoring  and Coughing up mucus (Bronchitis) Neurological: Curved spine and Incontinence:  Fecal Review of Past Neurological Studies: No results found for this or any previous visit.  Psychological-Psychiatric: No reported psychological or psychiatric signs or symptoms such as difficulty sleeping, anxiety, depression, delusions or hallucinations (schizophrenial), mood swings (bipolar disorders) or suicidal ideations or attempts Gastrointestinal: Reflux or heatburn and Alternating episodes iof diarrhea and constipation (IBS-Irritable bowe syndrome) Genitourinary: No reported renal or genitourinary signs or symptoms such as difficulty voiding or producing urine, peeing blood, non-functioning kidney, kidney stones, difficulty emptying the bladder, difficulty controlling the flow of urine, or chronic kidney disease Hematological: Brusing easily Endocrine: No reported endocrine signs or symptoms such as high or low blood sugar, rapid heart rate due to high thyroid levels, obesity or  weight gain due to slow thyroid or thyroid disease Rheumatologic: Joint aches and or swelling due to excess weight (Osteoarthritis), Generalized muscle aches (Fibromyalgia) and Constant unexplained fatigue (Chronic Fatigue Syndrome) Musculoskeletal: Negative for myasthenia gravis, muscular dystrophy, multiple sclerosis or malignant hyperthermia Work History: Quit going to work on his/her own  Allergies  Ms. Varon is allergic to sulfa antibiotics.  Laboratory Chemistry  Inflammation Markers (CRP: Acute Phase) (ESR: Chronic Phase) No results found for: CRP, ESRSEDRATE, LATICACIDVEN                       Rheumatology Markers No results found for: RF, ANA, LABURIC, URICUR, LYMEIGGIGMAB, LYMEABIGMQN, HLAB27                      Renal Function Markers Lab Results  Component Value Date   BUN 10 05/27/2014   CREATININE 0.64 05/27/2014   GFRAA >60 05/27/2014   GFRNONAA >60 05/27/2014                             Hepatic Function Markers No results found for: AST, ALT, ALBUMIN, ALKPHOS, HCVAB, AMYLASE, LIPASE, AMMONIA                      Electrolytes Lab Results  Component Value Date   NA 138 05/27/2014   K 3.6 05/27/2014   CL 103 05/27/2014   CALCIUM 8.1 (L) 05/27/2014                        Neuropathy Markers No results found for: VITAMINB12, FOLATE, HGBA1C, HIV                      CNS Tests No results found for: COLORCSF, APPEARCSF, RBCCOUNTCSF, WBCCSF, POLYSCSF, LYMPHSCSF, EOSCSF, PROTEINCSF, GLUCCSF, JCVIRUS, CSFOLI, IGGCSF                      Bone Pathology Markers No results found for: VD25OH, [id number], KM6286NO1, RR1165BX0, 25OHVITD1, 25OHVITD2, 25OHVITD3, TESTOFREE, TESTOSTERONE                       Coagulation Parameters Lab Results  Component Value Date   PLT 227 09/14/2017                        Cardiovascular Markers Lab Results  Component Value Date   TROPONINI < 0.02 05/27/2014   HGB 15.0 09/14/2017   HCT 45.9 09/14/2017                          CA Markers No results found for: CEA, CA125, LABCA2  Note: Lab results reviewed.  [place]  Drug: Ms. Galster  reports no history of drug use. Alcohol:  reports no history of alcohol use. Tobacco:  reports that she has been smoking. She has never used smokeless tobacco. Medical:  has a past medical history of Arthritis, Dyspnea, Fibromyalgia, and GERD (gastroesophageal reflux disease). Family: family history includes Cancer in her father and maternal aunt; Heart disease in her mother; Ovarian cancer in her sister; Rheum arthritis in her sister.  Past Surgical History:  Procedure Laterality Date  . COLONOSCOPY WITH PROPOFOL N/A 09/21/2017   Procedure: COLONOSCOPY WITH PROPOFOL;  Surgeon: Jonathon Bellows, MD;  Location: [facility] ENDOSCOPY;  Service: Gastroenterology;  Laterality: N/A;  . DILATION AND CURETTAGE OF UTERUS    . ESOPHAGOGASTRODUODENOSCOPY (EGD) WITH PROPOFOL N/A 09/21/2017   Procedure: ESOPHAGOGASTRODUODENOSCOPY (EGD) WITH PROPOFOL;  Surgeon: Jonathon Bellows, MD;  Location: [facility] ENDOSCOPY;  Service: Gastroenterology;  Laterality: N/A;  . ovarian cyst removed     Active Ambulatory Problems    Diagnosis Date Noted  . Heart murmur 08/31/2016  . Chronic pelvic pain in female 08/05/2016  . Fibromyalgia 07/12/2018  . Chronic dental pain 07/12/2018  . Chronic pain syndrome 07/12/2018  . Thoracic spine pain 07/12/2018  . Chronic bilateral low back pain without sciatica 07/12/2018   Resolved Ambulatory Problems    Diagnosis Date Noted  . No Resolved Ambulatory Problems   Past Medical History:  Diagnosis Date  . Arthritis   . Dyspnea   . GERD (gastroesophageal reflux disease)    Constitutional Exam  General appearance: Well nourished, well developed, and well hydrated. In no apparent acute distress Vitals:   07/12/18 0909 07/12/18 0912  BP:  (!) 143/105  Pulse: 92   Resp: 18   Temp: 98.4 F (36.9 C)   SpO2: 98%   Weight: 186 lb (84.4 kg)   Height: '5\' 2"'$   (1.575 m)    BMI Assessment: Estimated body mass index is 34.02 kg/m as calculated from the following:   Height as of this encounter: '5\' 2"'$  (1.575 m).   Weight as of this encounter: 186 lb (84.4 kg).  BMI interpretation table: BMI level Category Range association with higher incidence of chronic pain  <18 kg/m2 Underweight   18.5-24.9 kg/m2 Ideal body weight   25-29.9 kg/m2 Overweight Increased incidence by 20%  30-34.9 kg/m2 Obese (Class I) Increased incidence by 68%  35-39.9 kg/m2 Severe obesity (Class II) Increased incidence by 136%  >40 kg/m2 Extreme obesity (Class III) Increased incidence by 254%   Patient's current BMI Ideal Body weight  Body mass index is 34.02 kg/m. Ideal body weight: 50.1 kg (110 lb 7.2 oz) Adjusted ideal body weight: 63.8 kg (140 lb 10.7 oz)   BMI Readings from Last 4 Encounters:  07/12/18 34.02 kg/m  09/21/17 34.02 kg/m  09/14/17 34.24 kg/m  07/28/16 32.69 kg/m   Wt Readings from Last 4 Encounters:  07/12/18 186 lb (84.4 kg)  09/21/17 189 lb (85.7 kg)  09/14/17 196 lb 6.4 oz (89.1 kg)  07/28/16 187 lb 8 oz (85 kg)  Psych/Mental status: Alert, oriented x 3 (person, place, & time)       Eyes: PERLA Respiratory: No evidence of acute respiratory distress  Cervical Spine Area Exam  Skin & Axial Inspection: No masses, redness, edema, swelling, or associated skin lesions Alignment: Symmetrical Functional ROM: Unrestricted ROM      Stability: No instability detected Muscle Tone/Strength: Functionally intact. No obvious neuro-muscular anomalies detected. Sensory (Neurological): Musculoskeletal pain pattern Palpation: No palpable  anomalies              Upper Extremity (UE) Exam    Side: Right upper extremity  Side: Left upper extremity  Skin & Extremity Inspection: Skin color, temperature, and hair growth are WNL. No peripheral edema or cyanosis. No masses, redness, swelling, asymmetry, or associated skin lesions. No contractures.  Skin & Extremity  Inspection: Skin color, temperature, and hair growth are WNL. No peripheral edema or cyanosis. No masses, redness, swelling, asymmetry, or associated skin lesions. No contractures.  Functional ROM: Unrestricted ROM          Functional ROM: Unrestricted ROM          Muscle Tone/Strength: Functionally intact. No obvious neuro-muscular anomalies detected.  Muscle Tone/Strength: Functionally intact. No obvious neuro-muscular anomalies detected.  Sensory (Neurological): Unimpaired          Sensory (Neurological): Unimpaired          Palpation: No palpable anomalies              Palpation: No palpable anomalies              Provocative Test(s):  Phalen's test: deferred Tinel's test: deferred Apley's scratch test (touch opposite shoulder):  Action 1 (Across chest): deferred Action 2 (Overhead): deferred Action 3 (LB reach): deferred   Provocative Test(s):  Phalen's test: deferred Tinel's test: deferred Apley's scratch test (touch opposite shoulder):  Action 1 (Across chest): deferred Action 2 (Overhead): deferred Action 3 (LB reach): deferred    Thoracic Spine Area Exam  Skin & Axial Inspection: No masses, redness, or swelling Alignment: Symmetrical Functional ROM: Decreased ROM Stability: No instability detected Muscle Tone/Strength: Functionally intact. No obvious neuro-muscular anomalies detected. Sensory (Neurological): Musculoskeletal pain pattern Muscle strength & Tone: No palpable anomalies  Lumbar Spine Area Exam  Skin & Axial Inspection: No masses, redness, or swelling Alignment: Symmetrical Functional ROM: Decreased ROM       Stability: No instability detected Muscle Tone/Strength: Functionally intact. No obvious neuro-muscular anomalies detected. Sensory (Neurological): Musculoskeletal pain pattern Palpation: No palpable anomalies       Provocative Tests: Hyperextension/rotation test: (+) due to pain. Lumbar quadrant test (Kemp's test): deferred today       Lateral  bending test: (+) due to pain. Patrick's Maneuver: deferred today                   FABER* test: (+) for bilateral S-I arthralgia             S-I anterior distraction/compression test: deferred today         S-I lateral compression test: deferred today         S-I Thigh-thrust test: deferred today         S-I Gaenslen's test: deferred today         *(Flexion, ABduction and External Rotation)  Gait & Posture Assessment  Ambulation: Unassisted Gait: Relatively normal for age and body habitus Posture: WNL   Lower Extremity Exam    Side: Right lower extremity  Side: Left lower extremity  Stability: No instability observed          Stability: No instability observed          Skin & Extremity Inspection: Skin color, temperature, and hair growth are WNL. No peripheral edema or cyanosis. No masses, redness, swelling, asymmetry, or associated skin lesions. No contractures.  Skin & Extremity Inspection: Skin color, temperature, and hair growth are WNL. No peripheral edema or cyanosis. No masses, redness, swelling,  asymmetry, or associated skin lesions. No contractures.  Functional ROM: Decreased ROM for hip and knee joints          Functional ROM: Decreased ROM for hip and knee joints          Muscle Tone/Strength: Functionally intact. No obvious neuro-muscular anomalies detected.  Muscle Tone/Strength: Functionally intact. No obvious neuro-muscular anomalies detected.  Sensory (Neurological): Unimpaired        Sensory (Neurological): Unimpaired        DTR: Patellar: deferred today Achilles: deferred today Plantar: deferred today  DTR: Patellar: deferred today Achilles: deferred today Plantar: deferred today  Palpation: No palpable anomalies  Palpation: No palpable anomalies   Assessment  Primary Diagnosis & Pertinent Problem List: The primary encounter diagnosis was Fibromyalgia. Diagnoses of Chronic dental pain, Chronic pain syndrome, Thoracic spine pain, and Chronic bilateral low back pain  without sciatica were also pertinent to this visit.  Visit Diagnosis (New problems to examiner): 1. Fibromyalgia   2. Chronic dental pain   3. Chronic pain syndrome   4. Thoracic spine pain   5. Chronic bilateral low back pain without sciatica    I had a thorough discussion with our patient regarding prescription of opioid medications.  Patient is primarily here for continuation of opioid therapy.  I informed the patient that we will need to work-up her thoracic and lumbar spine pain to determine its etiology before considering continuation of opioid medications.  I also informed the patient of our interventional pain practice and that after imaging results, depending upon what they showed, could discuss interventional therapies such as facet medial branch nerve blocks, radiofrequency ablation, erector spinae blocks, epidural steroid injection.  Patient is not interested in any interventional therapy.  Without work-up, I do not feel comfortable continuing or prescribing this patient's chronic opioid medications.  Furthermore, chronic opioid therapy is not recommended for fibromyalgia and can even exacerbate pain related to fibromyalgia.  I recommend physical therapy and aquatic therapy which the patient declined.  I offered the patient work-up of her back pain by way of imaging studies but the patient declined.  Provider-requested follow-up: No follow-ups on file.  No future appointments.  Primary Care Physician: Cletis Athens, MD Location: [facility] Outpatient Pain Management Facility Note by: Gillis Santa, M.D, Date: 07/12/2018; Time: 10:43 AM  There are no Patient Instructions on file for this visit.

## 2018-07-13 DIAGNOSIS — R5382 Chronic fatigue, unspecified: Secondary | ICD-10-CM | POA: Diagnosis not present

## 2018-07-13 DIAGNOSIS — M797 Fibromyalgia: Secondary | ICD-10-CM | POA: Diagnosis not present

## 2018-07-13 DIAGNOSIS — E7849 Other hyperlipidemia: Secondary | ICD-10-CM | POA: Diagnosis not present

## 2018-07-13 DIAGNOSIS — R5381 Other malaise: Secondary | ICD-10-CM | POA: Diagnosis not present

## 2018-07-13 DIAGNOSIS — N319 Neuromuscular dysfunction of bladder, unspecified: Secondary | ICD-10-CM | POA: Diagnosis not present

## 2018-07-13 DIAGNOSIS — I10 Essential (primary) hypertension: Secondary | ICD-10-CM | POA: Diagnosis not present

## 2018-08-09 DIAGNOSIS — D259 Leiomyoma of uterus, unspecified: Secondary | ICD-10-CM | POA: Diagnosis not present

## 2018-08-09 DIAGNOSIS — N319 Neuromuscular dysfunction of bladder, unspecified: Secondary | ICD-10-CM | POA: Diagnosis not present

## 2018-08-15 DIAGNOSIS — R5382 Chronic fatigue, unspecified: Secondary | ICD-10-CM | POA: Diagnosis not present

## 2018-08-15 DIAGNOSIS — M5126 Other intervertebral disc displacement, lumbar region: Secondary | ICD-10-CM | POA: Diagnosis not present

## 2018-08-15 DIAGNOSIS — M797 Fibromyalgia: Secondary | ICD-10-CM | POA: Diagnosis not present

## 2018-08-15 DIAGNOSIS — D259 Leiomyoma of uterus, unspecified: Secondary | ICD-10-CM | POA: Diagnosis not present

## 2018-09-06 DIAGNOSIS — M797 Fibromyalgia: Secondary | ICD-10-CM | POA: Diagnosis not present

## 2018-09-06 DIAGNOSIS — D259 Leiomyoma of uterus, unspecified: Secondary | ICD-10-CM | POA: Diagnosis not present

## 2018-09-06 DIAGNOSIS — R5382 Chronic fatigue, unspecified: Secondary | ICD-10-CM | POA: Diagnosis not present

## 2018-09-06 DIAGNOSIS — M5126 Other intervertebral disc displacement, lumbar region: Secondary | ICD-10-CM | POA: Diagnosis not present

## 2018-10-13 DIAGNOSIS — M76899 Other specified enthesopathies of unspecified lower limb, excluding foot: Secondary | ICD-10-CM | POA: Diagnosis not present

## 2018-10-13 DIAGNOSIS — N319 Neuromuscular dysfunction of bladder, unspecified: Secondary | ICD-10-CM | POA: Diagnosis not present

## 2018-10-13 DIAGNOSIS — R5382 Chronic fatigue, unspecified: Secondary | ICD-10-CM | POA: Diagnosis not present

## 2018-10-13 DIAGNOSIS — M797 Fibromyalgia: Secondary | ICD-10-CM | POA: Diagnosis not present

## 2018-11-11 DIAGNOSIS — N319 Neuromuscular dysfunction of bladder, unspecified: Secondary | ICD-10-CM | POA: Diagnosis not present

## 2018-11-11 DIAGNOSIS — M797 Fibromyalgia: Secondary | ICD-10-CM | POA: Diagnosis not present

## 2018-11-11 DIAGNOSIS — R5382 Chronic fatigue, unspecified: Secondary | ICD-10-CM | POA: Diagnosis not present

## 2018-11-11 DIAGNOSIS — Z0000 Encounter for general adult medical examination without abnormal findings: Secondary | ICD-10-CM | POA: Diagnosis not present

## 2018-11-11 DIAGNOSIS — M76899 Other specified enthesopathies of unspecified lower limb, excluding foot: Secondary | ICD-10-CM | POA: Diagnosis not present

## 2018-11-14 DIAGNOSIS — E785 Hyperlipidemia, unspecified: Secondary | ICD-10-CM | POA: Diagnosis not present

## 2018-11-14 DIAGNOSIS — K219 Gastro-esophageal reflux disease without esophagitis: Secondary | ICD-10-CM | POA: Diagnosis not present

## 2018-11-14 DIAGNOSIS — R69 Illness, unspecified: Secondary | ICD-10-CM | POA: Diagnosis not present

## 2018-11-14 DIAGNOSIS — M1990 Unspecified osteoarthritis, unspecified site: Secondary | ICD-10-CM | POA: Diagnosis not present

## 2018-11-14 DIAGNOSIS — E669 Obesity, unspecified: Secondary | ICD-10-CM | POA: Diagnosis not present

## 2018-11-14 DIAGNOSIS — G629 Polyneuropathy, unspecified: Secondary | ICD-10-CM | POA: Diagnosis not present

## 2018-11-14 DIAGNOSIS — I10 Essential (primary) hypertension: Secondary | ICD-10-CM | POA: Diagnosis not present

## 2018-11-14 DIAGNOSIS — I499 Cardiac arrhythmia, unspecified: Secondary | ICD-10-CM | POA: Diagnosis not present

## 2018-11-14 DIAGNOSIS — G8929 Other chronic pain: Secondary | ICD-10-CM | POA: Diagnosis not present

## 2018-11-14 DIAGNOSIS — K08409 Partial loss of teeth, unspecified cause, unspecified class: Secondary | ICD-10-CM | POA: Diagnosis not present

## 2018-12-09 DIAGNOSIS — N319 Neuromuscular dysfunction of bladder, unspecified: Secondary | ICD-10-CM | POA: Diagnosis not present

## 2018-12-09 DIAGNOSIS — M5126 Other intervertebral disc displacement, lumbar region: Secondary | ICD-10-CM | POA: Diagnosis not present

## 2019-01-12 DIAGNOSIS — D259 Leiomyoma of uterus, unspecified: Secondary | ICD-10-CM | POA: Diagnosis not present

## 2019-01-12 DIAGNOSIS — R5382 Chronic fatigue, unspecified: Secondary | ICD-10-CM | POA: Diagnosis not present

## 2019-01-12 DIAGNOSIS — M797 Fibromyalgia: Secondary | ICD-10-CM | POA: Diagnosis not present

## 2019-02-07 DIAGNOSIS — R5382 Chronic fatigue, unspecified: Secondary | ICD-10-CM | POA: Diagnosis not present

## 2019-02-07 DIAGNOSIS — M797 Fibromyalgia: Secondary | ICD-10-CM | POA: Diagnosis not present

## 2019-02-07 DIAGNOSIS — K029 Dental caries, unspecified: Secondary | ICD-10-CM | POA: Diagnosis not present

## 2019-02-07 DIAGNOSIS — M76899 Other specified enthesopathies of unspecified lower limb, excluding foot: Secondary | ICD-10-CM | POA: Diagnosis not present

## 2019-03-09 DIAGNOSIS — Z882 Allergy status to sulfonamides status: Secondary | ICD-10-CM | POA: Diagnosis not present

## 2019-03-09 DIAGNOSIS — D259 Leiomyoma of uterus, unspecified: Secondary | ICD-10-CM | POA: Diagnosis not present

## 2019-03-09 DIAGNOSIS — Z8269 Family history of other diseases of the musculoskeletal system and connective tissue: Secondary | ICD-10-CM | POA: Diagnosis not present

## 2019-04-06 DIAGNOSIS — R5382 Chronic fatigue, unspecified: Secondary | ICD-10-CM | POA: Diagnosis not present

## 2019-04-06 DIAGNOSIS — M797 Fibromyalgia: Secondary | ICD-10-CM | POA: Diagnosis not present

## 2019-04-06 DIAGNOSIS — M5126 Other intervertebral disc displacement, lumbar region: Secondary | ICD-10-CM | POA: Diagnosis not present

## 2019-04-06 DIAGNOSIS — M76899 Other specified enthesopathies of unspecified lower limb, excluding foot: Secondary | ICD-10-CM | POA: Diagnosis not present

## 2019-04-18 ENCOUNTER — Other Ambulatory Visit: Payer: Self-pay | Admitting: Internal Medicine

## 2019-04-18 DIAGNOSIS — R1084 Generalized abdominal pain: Secondary | ICD-10-CM

## 2019-04-25 ENCOUNTER — Ambulatory Visit: Payer: Medicare HMO

## 2019-05-08 DIAGNOSIS — D259 Leiomyoma of uterus, unspecified: Secondary | ICD-10-CM | POA: Diagnosis not present

## 2019-05-08 DIAGNOSIS — R5382 Chronic fatigue, unspecified: Secondary | ICD-10-CM | POA: Diagnosis not present

## 2019-05-08 DIAGNOSIS — N319 Neuromuscular dysfunction of bladder, unspecified: Secondary | ICD-10-CM | POA: Diagnosis not present

## 2019-05-08 DIAGNOSIS — M797 Fibromyalgia: Secondary | ICD-10-CM | POA: Diagnosis not present

## 2019-06-06 DIAGNOSIS — N319 Neuromuscular dysfunction of bladder, unspecified: Secondary | ICD-10-CM | POA: Diagnosis not present

## 2019-06-06 DIAGNOSIS — M797 Fibromyalgia: Secondary | ICD-10-CM | POA: Diagnosis not present

## 2019-06-06 DIAGNOSIS — R5382 Chronic fatigue, unspecified: Secondary | ICD-10-CM | POA: Diagnosis not present

## 2019-06-06 DIAGNOSIS — D259 Leiomyoma of uterus, unspecified: Secondary | ICD-10-CM | POA: Diagnosis not present

## 2019-06-06 DIAGNOSIS — Z0000 Encounter for general adult medical examination without abnormal findings: Secondary | ICD-10-CM | POA: Diagnosis not present

## 2019-07-06 DIAGNOSIS — R5382 Chronic fatigue, unspecified: Secondary | ICD-10-CM | POA: Diagnosis not present

## 2019-07-06 DIAGNOSIS — M5126 Other intervertebral disc displacement, lumbar region: Secondary | ICD-10-CM | POA: Diagnosis not present

## 2019-07-06 DIAGNOSIS — M797 Fibromyalgia: Secondary | ICD-10-CM | POA: Diagnosis not present

## 2019-09-04 DIAGNOSIS — M76899 Other specified enthesopathies of unspecified lower limb, excluding foot: Secondary | ICD-10-CM | POA: Diagnosis not present

## 2019-09-04 DIAGNOSIS — N319 Neuromuscular dysfunction of bladder, unspecified: Secondary | ICD-10-CM | POA: Diagnosis not present

## 2019-09-04 DIAGNOSIS — Z882 Allergy status to sulfonamides status: Secondary | ICD-10-CM | POA: Diagnosis not present

## 2019-09-04 DIAGNOSIS — K0510 Chronic gingivitis, plaque induced: Secondary | ICD-10-CM | POA: Diagnosis not present

## 2019-09-13 DIAGNOSIS — G8929 Other chronic pain: Secondary | ICD-10-CM | POA: Diagnosis not present

## 2019-09-13 DIAGNOSIS — M797 Fibromyalgia: Secondary | ICD-10-CM | POA: Diagnosis not present

## 2019-09-13 DIAGNOSIS — M5442 Lumbago with sciatica, left side: Secondary | ICD-10-CM | POA: Diagnosis not present

## 2019-09-13 DIAGNOSIS — M5441 Lumbago with sciatica, right side: Secondary | ICD-10-CM | POA: Diagnosis not present

## 2019-09-26 DIAGNOSIS — M545 Low back pain: Secondary | ICD-10-CM | POA: Diagnosis not present

## 2019-09-26 DIAGNOSIS — M546 Pain in thoracic spine: Secondary | ICD-10-CM | POA: Diagnosis not present

## 2019-09-26 DIAGNOSIS — M5442 Lumbago with sciatica, left side: Secondary | ICD-10-CM | POA: Diagnosis not present

## 2019-09-26 DIAGNOSIS — M5136 Other intervertebral disc degeneration, lumbar region: Secondary | ICD-10-CM | POA: Diagnosis not present

## 2019-09-26 DIAGNOSIS — G8929 Other chronic pain: Secondary | ICD-10-CM | POA: Diagnosis not present

## 2019-09-26 DIAGNOSIS — M5441 Lumbago with sciatica, right side: Secondary | ICD-10-CM | POA: Diagnosis not present

## 2019-09-26 DIAGNOSIS — M47816 Spondylosis without myelopathy or radiculopathy, lumbar region: Secondary | ICD-10-CM | POA: Diagnosis not present

## 2019-09-26 DIAGNOSIS — M5416 Radiculopathy, lumbar region: Secondary | ICD-10-CM | POA: Diagnosis not present

## 2019-10-02 DIAGNOSIS — N319 Neuromuscular dysfunction of bladder, unspecified: Secondary | ICD-10-CM | POA: Diagnosis not present

## 2019-10-02 DIAGNOSIS — M5126 Other intervertebral disc displacement, lumbar region: Secondary | ICD-10-CM | POA: Diagnosis not present

## 2019-10-02 DIAGNOSIS — M76899 Other specified enthesopathies of unspecified lower limb, excluding foot: Secondary | ICD-10-CM | POA: Diagnosis not present

## 2019-10-04 DIAGNOSIS — R69 Illness, unspecified: Secondary | ICD-10-CM | POA: Diagnosis not present

## 2019-10-17 DIAGNOSIS — M797 Fibromyalgia: Secondary | ICD-10-CM | POA: Diagnosis not present

## 2019-10-17 DIAGNOSIS — M545 Low back pain: Secondary | ICD-10-CM | POA: Diagnosis not present

## 2019-10-17 DIAGNOSIS — G8929 Other chronic pain: Secondary | ICD-10-CM | POA: Diagnosis not present

## 2019-10-20 DIAGNOSIS — M545 Low back pain: Secondary | ICD-10-CM | POA: Diagnosis not present

## 2019-10-20 DIAGNOSIS — G8929 Other chronic pain: Secondary | ICD-10-CM | POA: Diagnosis not present

## 2019-10-20 DIAGNOSIS — M6281 Muscle weakness (generalized): Secondary | ICD-10-CM | POA: Diagnosis not present

## 2019-11-02 ENCOUNTER — Ambulatory Visit: Payer: Medicare HMO | Admitting: Internal Medicine

## 2019-11-02 DIAGNOSIS — G8929 Other chronic pain: Secondary | ICD-10-CM | POA: Diagnosis not present

## 2019-11-02 DIAGNOSIS — M545 Low back pain: Secondary | ICD-10-CM | POA: Diagnosis not present

## 2019-11-02 DIAGNOSIS — M47816 Spondylosis without myelopathy or radiculopathy, lumbar region: Secondary | ICD-10-CM | POA: Diagnosis not present

## 2019-11-17 ENCOUNTER — Ambulatory Visit (INDEPENDENT_AMBULATORY_CARE_PROVIDER_SITE_OTHER): Payer: Medicare HMO | Admitting: Internal Medicine

## 2019-11-17 ENCOUNTER — Encounter: Payer: Self-pay | Admitting: Internal Medicine

## 2019-11-17 ENCOUNTER — Other Ambulatory Visit: Payer: Self-pay

## 2019-11-17 VITALS — BP 133/71 | HR 90 | Wt 182.4 lb

## 2019-11-17 DIAGNOSIS — M797 Fibromyalgia: Secondary | ICD-10-CM | POA: Diagnosis not present

## 2019-11-17 DIAGNOSIS — I10 Essential (primary) hypertension: Secondary | ICD-10-CM

## 2019-11-17 DIAGNOSIS — G8929 Other chronic pain: Secondary | ICD-10-CM | POA: Diagnosis not present

## 2019-11-17 DIAGNOSIS — G894 Chronic pain syndrome: Secondary | ICD-10-CM | POA: Diagnosis not present

## 2019-11-17 DIAGNOSIS — M545 Low back pain, unspecified: Secondary | ICD-10-CM

## 2019-11-17 MED ORDER — TRAMADOL HCL 50 MG PO TABS: 50.0000 mg | ORAL_TABLET | Freq: Two times a day (BID) | ORAL | 0 refills | Status: AC

## 2019-11-17 NOTE — Assessment & Plan Note (Signed)
she has chronic lower back pain and now it is radiating to both legs and hip.  She has been given a shot at [facility] pain clinic and will follow it up.

## 2019-11-17 NOTE — Assessment & Plan Note (Signed)
Patient is going to see Pain specialist at [facility].

## 2019-11-17 NOTE — Assessment & Plan Note (Signed)
Patient has a chronic fibromyalgia rheumatologist back specialist in the past.

## 2019-11-17 NOTE — Progress Notes (Signed)
Established Patient Office Visit  Subjective:  Patient ID: Andrea Frazier, female    DOB: 09/30/1970  Age: 49 y.o. MRN: [id number]  CC:  Chief Complaint  Patient presents with  . 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 and then  she will get her treatment from them.  she continues to c/o  Pain inthe back and the lower thoracic spine.  Fibromuscular pain of both arms.  Past Medical History:  Diagnosis Date  . Arthritis    back  . Dyspnea   . Fibromyalgia   . GERD (gastroesophageal reflux disease)     Past Surgical History:  Procedure Laterality Date  . COLONOSCOPY WITH PROPOFOL N/A 09/21/2017   Procedure: COLONOSCOPY WITH PROPOFOL;  Surgeon: Jonathon Bellows, MD;  Location: [facility] ENDOSCOPY;  Service: Gastroenterology;  Laterality: N/A;  . DILATION AND CURETTAGE OF UTERUS    . ESOPHAGOGASTRODUODENOSCOPY (EGD) WITH PROPOFOL N/A 09/21/2017   Procedure: ESOPHAGOGASTRODUODENOSCOPY (EGD) WITH PROPOFOL;  Surgeon: Jonathon Bellows, MD;  Location: [facility] ENDOSCOPY;  Service: Gastroenterology;  Laterality: N/A;  . ovarian cyst removed      Family History  Problem Relation Age of Onset  . Rheum arthritis Sister   . Ovarian cancer Sister   . Cancer Maternal Aunt   . Heart disease Mother   . Cancer Father     Social History   Socioeconomic History  . Marital status: Married    Spouse name: Not on file  . Number of children: Not on file  . Years of education: Not on file  . Highest education level: Not on file  Occupational History  . Not on file  Tobacco Use  . Smoking status: Current Every Day Smoker  . Smokeless tobacco: Never Used  Substance and Sexual Activity  . Alcohol use: No  . Drug use: No  . Sexual activity: Yes  Other Topics Concern  . Not on file  Social History  Narrative  . Not on file   Social Determinants of Health   Financial Resource Strain:   . Difficulty of Paying Living Expenses:   Food Insecurity:   . Worried About Charity fundraiser in the Last Year:   . Arboriculturist in the Last Year:   Transportation Needs:   . Film/video editor (Medical):   Marland Kitchen Lack of Transportation (Non-Medical):   Physical Activity:   . Days of Exercise per Week:   . Minutes of Exercise per Session:   Stress:   . Feeling of Stress :   Social Connections:   . Frequency of Communication with Friends and Family:   . Frequency of Social Gatherings with Friends and Family:   . Attends Religious Services:   . Active Member of Clubs or Organizations:   . Attends Archivist Meetings:   Marland Kitchen Marital Status:   Intimate Partner Violence:   . Fear of Current or Ex-Partner:   . Emotionally Abused:   Marland Kitchen Physically Abused:   . Sexually Abused:      Current Outpatient Medications:  .  Aspirin-Salicylamide-Caffeine (BC HEADACHE POWDER PO), Take 1 Dose by mouth 3 (three) times daily as needed., Disp: , Rfl:  .  esomeprazole (NEXIUM) 40 MG capsule, Take 40  mg by mouth daily at 12 noon., Disp: , Rfl:  .  gabapentin (NEURONTIN) 600 MG tablet, Take by mouth., Disp: , Rfl:  .  HYDROcodone-acetaminophen (NORCO) 7.5-325 MG tablet, Take 1 tablet by mouth every 6 (six) hours as needed for moderate pain., Disp: , Rfl:  .  rosuvastatin (CRESTOR) 20 MG tablet, , Disp: , Rfl:  .  traMADol (ULTRAM) 50 MG tablet, Take 1 tablet (50 mg total) by mouth 2 (two) times daily., Disp: 60 tablet, Rfl: 0   Allergies  Allergen Reactions  . Sulfa Antibiotics Shortness Of Breath    ROS Review of Systems  Constitutional: Negative for chills.  HENT: Negative for sneezing.   Eyes: Negative for redness.  Respiratory: Negative for chest tightness.   Cardiovascular: Negative for leg swelling.  Gastrointestinal: Negative for abdominal distention.  Musculoskeletal: Positive for  arthralgias and back pain. Negative for joint swelling.  Neurological: Negative for dizziness.  Psychiatric/Behavioral: Negative for behavioral problems and confusion.      Objective:    Physical Exam  Constitutional: She is oriented to person, place, and time. She appears well-developed.  HENT:  Head: Atraumatic.  Eyes: Pupils are equal, round, and reactive to light.  Neck: No JVD present. No thyromegaly present.  Musculoskeletal:        General: Tenderness present.     Cervical back: Neck supple.     Lumbar back: Pain, spasms and tenderness present. No swelling.     Right upper leg: Tenderness present.     Left upper leg: Swelling and tenderness present.  Lymphadenopathy:    She has no cervical adenopathy.  Neurological: She is oriented to person, place, and time. No cranial nerve deficit.  Skin: No erythema.  Psychiatric: She has a normal mood and affect.    BP 133/71   Pulse 90   Wt 182 lb 6.4 oz (82.7 kg)   LMP 06/15/2016 (Exact Date)   BMI 33.36 kg/m  Wt Readings from Last 3 Encounters:  11/17/19 182 lb 6.4 oz (82.7 kg)  07/12/18 186 lb (84.4 kg)  09/21/17 189 lb (85.7 kg)     Health Maintenance Due  Topic Date Due  . COVID-19 Vaccine (1) Never done  . HIV Screening  Never done  . TETANUS/TDAP  Never done  . PAP SMEAR-Modifier  Never done  . MAMMOGRAM  Never done    There are no preventive care reminders to display for this patient.  No results found for: TSH Lab Results  Component Value Date   WBC 9.5 09/14/2017   HGB 15.0 09/14/2017   HCT 45.9 09/14/2017   MCV 91.0 09/14/2017   PLT 227 09/14/2017   Lab Results  Component Value Date   NA 138 05/27/2014   K 3.6 05/27/2014   CO2 29 05/27/2014   GLUCOSE 82 05/27/2014   BUN 10 05/27/2014   CREATININE 0.64 05/27/2014   CALCIUM 8.1 (L) 05/27/2014   ANIONGAP 6 (L) 05/27/2014   No results found for: CHOL No results found for: HDL No results found for: LDLCALC No results found for: TRIG No  results found for: CHOLHDL No results found for: HGBA1C    Assessment & Plan:   Problem List Items Addressed This Visit      Cardiovascular and Mediastinum   Essential hypertension/ Bp  Is stable      Other   Fibromyalgia    Patient has  chronic fibromyalgia   She has seen  Rheumatologist and back specialist in the past.  Relevant Medications   traMADol (ULTRAM) 50 MG tablet   Chronic pain syndrome - Primary    Patient is going to see Pain specialist at [facility].      Relevant Medications   traMADol (ULTRAM) 50 MG tablet   Chronic bilateral low back pain without sciatica    she has chronic lower back pain and now it is radiating to both legs and hip.  She has been given a shot at [facility] pain clinic and  they will follow it up.      Relevant Medications   traMADol (ULTRAM) 50 MG tablet      Meds ordered this encounter  Medications  . traMADol (ULTRAM) 50 MG tablet    Sig: Take 1 tablet (50 mg total) by mouth 2 (two) times daily.    Dispense:  60 tablet    Refill:  0    Follow-up: Return in about 4 weeks (around 12/15/2019).    Cletis Athens, MD

## 2019-11-28 DIAGNOSIS — M545 Low back pain: Secondary | ICD-10-CM | POA: Diagnosis not present

## 2019-11-28 DIAGNOSIS — M6281 Muscle weakness (generalized): Secondary | ICD-10-CM | POA: Diagnosis not present

## 2019-11-28 DIAGNOSIS — G8929 Other chronic pain: Secondary | ICD-10-CM | POA: Diagnosis not present

## 2019-11-29 DIAGNOSIS — G8929 Other chronic pain: Secondary | ICD-10-CM | POA: Diagnosis not present

## 2019-11-29 DIAGNOSIS — Z79899 Other long term (current) drug therapy: Secondary | ICD-10-CM | POA: Diagnosis not present

## 2019-11-29 DIAGNOSIS — M47816 Spondylosis without myelopathy or radiculopathy, lumbar region: Secondary | ICD-10-CM | POA: Diagnosis not present

## 2019-11-29 DIAGNOSIS — M545 Low back pain: Secondary | ICD-10-CM | POA: Diagnosis not present

## 2019-12-12 DIAGNOSIS — R69 Illness, unspecified: Secondary | ICD-10-CM | POA: Diagnosis not present

## 2019-12-19 ENCOUNTER — Ambulatory Visit: Payer: Medicare HMO | Admitting: Internal Medicine

## 2019-12-20 DIAGNOSIS — G8929 Other chronic pain: Secondary | ICD-10-CM | POA: Diagnosis not present

## 2019-12-20 DIAGNOSIS — M47817 Spondylosis without myelopathy or radiculopathy, lumbosacral region: Secondary | ICD-10-CM | POA: Diagnosis not present

## 2019-12-20 DIAGNOSIS — M545 Low back pain: Secondary | ICD-10-CM | POA: Diagnosis not present

## 2020-01-05 ENCOUNTER — Ambulatory Visit (INDEPENDENT_AMBULATORY_CARE_PROVIDER_SITE_OTHER): Payer: Medicare HMO | Admitting: Internal Medicine

## 2020-01-05 ENCOUNTER — Other Ambulatory Visit: Payer: Self-pay

## 2020-01-05 ENCOUNTER — Encounter: Payer: Self-pay | Admitting: Internal Medicine

## 2020-01-05 VITALS — BP 141/84 | HR 96 | Ht 63.0 in | Wt 178.6 lb

## 2020-01-05 DIAGNOSIS — G894 Chronic pain syndrome: Secondary | ICD-10-CM

## 2020-01-05 DIAGNOSIS — K047 Periapical abscess without sinus: Secondary | ICD-10-CM | POA: Diagnosis not present

## 2020-01-05 DIAGNOSIS — I10 Essential (primary) hypertension: Secondary | ICD-10-CM | POA: Diagnosis not present

## 2020-01-05 DIAGNOSIS — M797 Fibromyalgia: Secondary | ICD-10-CM | POA: Diagnosis not present

## 2020-01-05 MED ORDER — CEPHALEXIN 500 MG PO CAPS: 500.0000 mg | ORAL_CAPSULE | Freq: Four times a day (QID) | ORAL | 1 refills | Status: AC

## 2020-01-05 MED ORDER — GABAPENTIN 600 MG PO TABS: 600.0000 mg | ORAL_TABLET | Freq: Two times a day (BID) | ORAL | 3 refills | Status: DC

## 2020-01-05 MED ORDER — TRAMADOL HCL 50 MG PO TABS: 100.0000 mg | ORAL_TABLET | Freq: Three times a day (TID) | ORAL | 0 refills | Status: DC

## 2020-01-05 NOTE — Assessment & Plan Note (Signed)
Fibromyalgia is a chronic problem. She has seen the ruematologist in [place] and locally.

## 2020-01-05 NOTE — Assessment & Plan Note (Signed)
The patient has a chronic pain syndrome. Her Tramadol was renewed, 50 mg two tablets 3x daily. She was referred back to the pain specialist. The patient was advised to lose weight and walk daily and to join a gym if she can.

## 2020-01-05 NOTE — Assessment & Plan Note (Signed)
The patient's dental hygiene is poor. She has multiple carious teeth, both in the upper and lower jaw. She is under the care of her dentist and her teeth are being extracted as needed. She was given Keflex 500 mg P.O. 4x daily for seven days.

## 2020-01-05 NOTE — Progress Notes (Signed)
Established Patient Office Visit  SUBJECTIVE:  Subjective  Patient ID: Andrea Frazier, female    DOB: [birth year]  Age: 49 y.o. MRN: [id number]  CC:  Chief Complaint  Patient presents with  . Pain    patient here for refill of tramadol and gabapentin     HPI Andrea Frazier is a 49 y.o. female presenting today for evaluation of upper and lower dental pain on the left side of her mouth. She also reports pain and swelling of her upper gum. She states that she has an appointment with her dentist in two weeks to have multiple teeth extracted. After the patient has all of her teeth extracted, she will be fitted for dentures. She also reports her chronic back pain secondary to Fibromyalgia.  Past Medical History:  Diagnosis Date  . Arthritis    back  . Dyspnea   . Fibromyalgia   . GERD (gastroesophageal reflux disease)     Past Surgical History:  Procedure Laterality Date  . COLONOSCOPY WITH PROPOFOL N/A 09/21/2017   Procedure: COLONOSCOPY WITH PROPOFOL;  Surgeon: Jonathon Bellows, MD;  Location: [facility] ENDOSCOPY;  Service: Gastroenterology;  Laterality: N/A;  . DILATION AND CURETTAGE OF UTERUS    . ESOPHAGOGASTRODUODENOSCOPY (EGD) WITH PROPOFOL N/A 09/21/2017   Procedure: ESOPHAGOGASTRODUODENOSCOPY (EGD) WITH PROPOFOL;  Surgeon: Jonathon Bellows, MD;  Location: [facility] ENDOSCOPY;  Service: Gastroenterology;  Laterality: N/A;  . ovarian cyst removed      Family History  Problem Relation Age of Onset  . Rheum arthritis Sister   . Ovarian cancer Sister   . Cancer Maternal Aunt   . Heart disease Mother   . Cancer Father     Social History   Socioeconomic History  . Marital status: Married    Spouse name: Not on file  . Number of children: Not on file  . Years of education: Not on file  . Highest education level: Not on file  Occupational History  . Not on file  Tobacco Use  . Smoking status: Current Every Day Smoker  . Smokeless tobacco: Never Used  Vaping Use  . Vaping Use:  Never used  Substance and Sexual Activity  . Alcohol use: No  . Drug use: No  . Sexual activity: Yes  Other Topics Concern  . Not on file  Social History Narrative  . Not on file   Social Determinants of Health   Financial Resource Strain:   . Difficulty of Paying Living Expenses:   Food Insecurity:   . Worried About Charity fundraiser in the Last Year:   . Arboriculturist in the Last Year:   Transportation Needs:   . Film/video editor (Medical):   Marland Kitchen Lack of Transportation (Non-Medical):   Physical Activity:   . Days of Exercise per Week:   . Minutes of Exercise per Session:   Stress:   . Feeling of Stress :   Social Connections:   . Frequency of Communication with Friends and Family:   . Frequency of Social Gatherings with Friends and Family:   . Attends Religious Services:   . Active Member of Clubs or Organizations:   . Attends Archivist Meetings:   Marland Kitchen Marital Status:   Intimate Partner Violence:   . Fear of Current or Ex-Partner:   . Emotionally Abused:   Marland Kitchen Physically Abused:   . Sexually Abused:      Current Outpatient Medications:  .  Aspirin-Salicylamide-Caffeine (BC HEADACHE POWDER PO), Take 1 Dose  by mouth 3 (three) times daily as needed., Disp: , Rfl:  .  esomeprazole (NEXIUM) 40 MG capsule, Take 40 mg by mouth daily at 12 noon., Disp: , Rfl:  .  gabapentin (NEURONTIN) 600 MG tablet, Take 1 tablet (600 mg total) by mouth 2 (two) times daily., Disp: 60 tablet, Rfl: 3 .  HYDROcodone-acetaminophen (NORCO) 7.5-325 MG tablet, Take 1 tablet by mouth every 6 (six) hours as needed for moderate pain., Disp: , Rfl:  .  rosuvastatin (CRESTOR) 20 MG tablet, , Disp: , Rfl:  .  traMADol (ULTRAM) 50 MG tablet, Take 2 tablets (100 mg total) by mouth 3 (three) times daily., Disp: 180 tablet, Rfl: 0 .  cephALEXin (KEFLEX) 500 MG capsule, Take 1 capsule (500 mg total) by mouth 4 (four) times daily for 7 days., Disp: 28 capsule, Rfl: 1   Allergies  Allergen  Reactions  . Sulfa Antibiotics Shortness Of Breath    ROS Review of Systems  Constitutional: Negative.   HENT: Positive for dental problem.   Eyes: Negative.   Respiratory: Negative.   Cardiovascular: Negative.   Gastrointestinal: Negative.   Endocrine: Negative.   Genitourinary: Negative.   Musculoskeletal:       Chronic back pain secondary to Fibromyalgia  Skin: Negative.   Allergic/Immunologic: Negative.   Neurological: Negative.   Hematological: Negative.   Psychiatric/Behavioral: Negative.   All other systems reviewed and are negative.    OBJECTIVE:    Physical Exam Vitals reviewed.  Constitutional:      Appearance: Normal appearance.  HENT:     Mouth/Throat:     Comments: Diffuse dental carries, worse on the left. Multiple upper teeth are absent.  Eyes:     Pupils: Pupils are equal, round, and reactive to light.  Neck:     Vascular: No carotid bruit.  Cardiovascular:     Rate and Rhythm: Normal rate and regular rhythm.     Pulses: Normal pulses.     Heart sounds: Normal heart sounds.  Pulmonary:     Effort: Pulmonary effort is normal.     Breath sounds: Normal breath sounds.  Abdominal:     General: Bowel sounds are normal.     Palpations: Abdomen is soft. There is no hepatomegaly, splenomegaly or mass.     Tenderness: There is no abdominal tenderness.     Hernia: No hernia is present.  Musculoskeletal:        General: No tenderness.     Cervical back: Neck supple.     Right lower leg: No edema.     Left lower leg: No edema.  Lymphadenopathy:     Cervical: No cervical adenopathy.  Skin:    Findings: No rash.  Neurological:     Mental Status: She is alert and oriented to person, place, and time.     Motor: No weakness.  Psychiatric:        Mood and Affect: Mood and affect normal.        Behavior: Behavior normal.     BP (!) 141/84   Pulse 96   Ht 5\' 3"  (1.6 m)   Wt 178 lb 9.6 oz (81 kg)   LMP 06/15/2016 (Exact Date)   BMI 31.64 kg/m  Wt  Readings from Last 3 Encounters:  01/05/20 178 lb 9.6 oz (81 kg)  11/17/19 182 lb 6.4 oz (82.7 kg)  07/12/18 186 lb (84.4 kg)    Health Maintenance Due  Topic Date Due  . Hepatitis C Screening  Never done  .  COVID-19 Vaccine (1) Never done  . HIV Screening  Never done  . TETANUS/TDAP  Never done  . PAP SMEAR-Modifier  Never done  . MAMMOGRAM  Never done    There are no preventive care reminders to display for this patient.  CBC Latest Ref Rng & Units 09/14/2017 05/27/2014  WBC 3.6 - 11.0 K/uL 9.5 8.1  Hemoglobin 12.0 - 16.0 g/dL 15.0 15.0  Hematocrit 35 - 47 % 45.9 45.2  Platelets 150 - 440 K/uL 227 220   CMP Latest Ref Rng & Units 05/27/2014  Glucose 65 - 99 mg/dL 82  BUN 7 - 18 mg/dL 10  Creatinine 0.60 - 1.30 mg/dL 0.64  Sodium 136 - 145 mmol/L 138  Potassium 3.5 - 5.1 mmol/L 3.6  Chloride 98 - 107 mmol/L 103  CO2 21 - 32 mmol/L 29  Calcium 8.5 - 10.1 mg/dL 8.1(L)    No results found for: TSH Lab Results  Component Value Date   ANIONGAP 6 (L) 05/27/2014   No results found for: CHOL, HDL, LDLCALC, CHOLHDL No results found for: TRIG No results found for: HGBA1C    ASSESSMENT & PLAN:   Problem List Items Addressed This Visit      Cardiovascular and Mediastinum   Essential hypertension    The patient's blood pressure is 141/84, which is under control on diet. She was advised to lose weight and follow a low cholesterol diet.        Digestive   Dental abscess - Primary    The patient's dental hygiene is poor. She has multiple carious teeth, both in the upper and lower jaw. She is under the care of her dentist and her teeth are being extracted as needed. She was given Keflex 500 mg P.O. 4x daily for seven days.       Relevant Medications   cephALEXin (KEFLEX) 500 MG capsule     Other   Fibromyalgia    Fibromyalgia is a chronic problem. She has seen the ruematologist in [place] and locally.       Relevant Medications   traMADol (ULTRAM) 50 MG tablet     gabapentin (NEURONTIN) 600 MG tablet   Chronic pain syndrome    The patient has a chronic pain syndrome. Her Tramadol was renewed, 50 mg two tablets 3x daily. She was referred back to the pain specialist. The patient was advised to lose weight and walk daily and to join a gym if she can.        Relevant Medications   traMADol (ULTRAM) 50 MG tablet   gabapentin (NEURONTIN) 600 MG tablet      Meds ordered this encounter  Medications  . traMADol (ULTRAM) 50 MG tablet    Sig: Take 2 tablets (100 mg total) by mouth 3 (three) times daily.    Dispense:  180 tablet    Refill:  0  . gabapentin (NEURONTIN) 600 MG tablet    Sig: Take 1 tablet (600 mg total) by mouth 2 (two) times daily.    Dispense:  60 tablet    Refill:  3  . cephALEXin (KEFLEX) 500 MG capsule    Sig: Take 1 capsule (500 mg total) by mouth 4 (four) times daily for 7 days.    Dispense:  28 capsule    Refill:  1    Follow-up: Return in about 3 months (around 04/06/2020).    Dr. Jane Canary [facility] [address] [place], [place] [address]   By signing  my name below, I, Clerance Lav, attest that this documentation has been prepared under the direction and in the presence of Cletis Athens, MD. Electronically Signed: Cletis Athens, MD 01/05/20, 12:31 PM   I personally performed the services described in this documentation, which was SCRIBED in my presence. The recorded information has been reviewed and considered accurate. It has been edited as necessary during review. Cletis Athens, MD

## 2020-01-05 NOTE — Assessment & Plan Note (Signed)
The patient's blood pressure is 141/84, which is under control on diet. She was advised to lose weight and follow a low cholesterol diet.

## 2020-02-05 ENCOUNTER — Ambulatory Visit: Payer: Medicare HMO | Admitting: Internal Medicine

## 2020-02-12 ENCOUNTER — Other Ambulatory Visit: Payer: Self-pay

## 2020-02-12 ENCOUNTER — Encounter: Payer: Self-pay | Admitting: Internal Medicine

## 2020-02-12 ENCOUNTER — Ambulatory Visit (INDEPENDENT_AMBULATORY_CARE_PROVIDER_SITE_OTHER): Payer: Medicare HMO | Admitting: Internal Medicine

## 2020-02-12 VITALS — BP 148/93 | HR 84 | Ht 65.0 in | Wt 185.4 lb

## 2020-02-12 DIAGNOSIS — M5442 Lumbago with sciatica, left side: Secondary | ICD-10-CM | POA: Diagnosis not present

## 2020-02-12 DIAGNOSIS — K089 Disorder of teeth and supporting structures, unspecified: Secondary | ICD-10-CM | POA: Diagnosis not present

## 2020-02-12 DIAGNOSIS — G8929 Other chronic pain: Secondary | ICD-10-CM

## 2020-02-12 DIAGNOSIS — M5441 Lumbago with sciatica, right side: Secondary | ICD-10-CM | POA: Diagnosis not present

## 2020-02-12 DIAGNOSIS — I10 Essential (primary) hypertension: Secondary | ICD-10-CM

## 2020-02-12 DIAGNOSIS — G894 Chronic pain syndrome: Secondary | ICD-10-CM

## 2020-02-12 MED ORDER — TRAMADOL HCL 50 MG PO TABS: 100.0000 mg | ORAL_TABLET | Freq: Three times a day (TID) | ORAL | 0 refills | Status: DC

## 2020-02-12 MED ORDER — TRAMADOL HCL 50 MG PO TABS
100.0000 mg | ORAL_TABLET | Freq: Three times a day (TID) | ORAL | 0 refills | Status: DC
Start: 2020-02-12 — End: 2020-03-01

## 2020-02-12 NOTE — Assessment & Plan Note (Signed)
Blood pressure is stable at the present time (148/98 today)

## 2020-02-12 NOTE — Assessment & Plan Note (Signed)
Pt has chronic pain in the left jaw due to carries of multiple teeth. She is referred back to her dentist and evaluation. She had an appointment on Wednesday to get a tooth pulled.

## 2020-02-12 NOTE — Progress Notes (Signed)
Established Patient Office Visit  SUBJECTIVE:  Subjective  Patient ID: Andrea Frazier, female    DOB: 14-Jul-1970  Age: 49 y.o. MRN: [id number]  CC:  Chief Complaint  Patient presents with   Pain    patient needs refill of Tramadol     HPI [name] is a 49 y.o. female presenting today for a medication refill.   She underwent a lumbar radio frequency ablation on 12/20/2019 and she notes that it helped her pain in her pelvis, but she still feels pain everywhere else in her body.   She is currently having some dental work done, which she is relieved about since they have been causing her so many issues.    Past Medical History:  Diagnosis Date   Arthritis    back   Dyspnea    Fibromyalgia    GERD (gastroesophageal reflux disease)     Past Surgical History:  Procedure Laterality Date   COLONOSCOPY WITH PROPOFOL N/A 09/21/2017   Procedure: COLONOSCOPY WITH PROPOFOL;  Surgeon: Jonathon Bellows, MD;  Location: [facility] ENDOSCOPY;  Service: Gastroenterology;  Laterality: N/A;   DILATION AND CURETTAGE OF UTERUS     ESOPHAGOGASTRODUODENOSCOPY (EGD) WITH PROPOFOL N/A 09/21/2017   Procedure: ESOPHAGOGASTRODUODENOSCOPY (EGD) WITH PROPOFOL;  Surgeon: Jonathon Bellows, MD;  Location: [facility] ENDOSCOPY;  Service: Gastroenterology;  Laterality: N/A;   ovarian cyst removed      Family History  Problem Relation Age of Onset   Rheum arthritis Sister    Ovarian cancer Sister    Cancer Maternal Aunt    Heart disease Mother    Cancer Father     Social History   Socioeconomic History   Marital status: Married    Spouse name: Not on file   Number of children: Not on file   Years of education: Not on file   Highest education level: Not on file  Occupational History   Not on file  Tobacco Use   Smoking status: Current Every Day Smoker   Smokeless tobacco: Never Used  Vaping Use   Vaping Use: Never used  Substance and Sexual Activity   Alcohol use: No   Drug  use: No   Sexual activity: Yes  Other Topics Concern   Not on file  Social History Narrative   Not on file   Social Determinants of Health   Financial Resource Strain:    Difficulty of Paying Living Expenses: Not on file  Food Insecurity:    Worried About [place] in the Last Year: Not on file   Ran Out of Food in the Last Year: Not on file  Transportation Needs:    Lack of Transportation (Medical): Not on file   Lack of Transportation (Non-Medical): Not on file  Physical Activity:    Days of Exercise per Week: Not on file   Minutes of Exercise per Session: Not on file  Stress:    Feeling of Stress : Not on file  Social Connections:    Frequency of Communication with Friends and Family: Not on file   Frequency of Social Gatherings with Friends and Family: Not on file   Attends Religious Services: Not on file   Active Member of Clubs or Organizations: Not on file   Attends Archivist Meetings: Not on file   Marital Status: Not on file  Intimate Partner Violence:    Fear of Current or Ex-Partner: Not on file   Emotionally Abused: Not on file   Physically Abused: Not  on file   Sexually Abused: Not on file     Current Outpatient Medications:    Aspirin-Salicylamide-Caffeine (BC HEADACHE POWDER PO), Take 1 Dose by mouth 3 (three) times daily as needed., Disp: , Rfl:    esomeprazole (NEXIUM) 40 MG capsule, Take 40 mg by mouth daily at 12 noon., Disp: , Rfl:    gabapentin (NEURONTIN) 600 MG tablet, Take 1 tablet (600 mg total) by mouth 2 (two) times daily., Disp: 60 tablet, Rfl: 3   rosuvastatin (CRESTOR) 20 MG tablet, , Disp: , Rfl:    traMADol (ULTRAM) 50 MG tablet, Take 2 tablets (100 mg total) by mouth 3 (three) times daily., Disp: 180 tablet, Rfl: 0   Allergies  Allergen Reactions   Sulfa Antibiotics Shortness Of Breath    ROS Review of Systems  Constitutional: Negative.   HENT: Positive for dental problem.   Eyes:  Negative.   Respiratory: Negative.   Cardiovascular: Negative.   Gastrointestinal: Negative.   Endocrine: Negative.   Genitourinary: Negative.   Musculoskeletal: Positive for arthralgias, back pain and neck pain.  Skin: Negative.   Allergic/Immunologic: Negative.   Neurological: Negative.   Hematological: Negative.   Psychiatric/Behavioral: Positive for sleep disturbance (due to pain).  All other systems reviewed and are negative.    OBJECTIVE:    Physical Exam Vitals reviewed.  Constitutional:      Appearance: Normal appearance.  HENT:     Mouth/Throat:     Mouth: Mucous membranes are moist.  Eyes:     Pupils: Pupils are equal, round, and reactive to light.  Neck:     Vascular: No carotid bruit.  Cardiovascular:     Rate and Rhythm: Normal rate and regular rhythm.     Pulses: Normal pulses.     Heart sounds: Normal heart sounds.  Pulmonary:     Effort: Pulmonary effort is normal.     Breath sounds: Normal breath sounds.  Abdominal:     General: Bowel sounds are normal.     Palpations: Abdomen is soft. There is no hepatomegaly, splenomegaly or mass.     Tenderness: There is no abdominal tenderness.     Hernia: No hernia is present.  Musculoskeletal:        General: No tenderness.     Cervical back: Neck supple.     Right lower leg: No edema.     Left lower leg: No edema.  Skin:    Findings: No rash.  Neurological:     Mental Status: She is alert and oriented to person, place, and time.     Motor: No weakness.  Psychiatric:        Mood and Affect: Mood and affect normal.        Behavior: Behavior normal.     BP (!) 148/93    Pulse 84    Ht 5\' 5"  (1.651 m)    Wt 185 lb 6.4 oz (84.1 kg)    LMP 06/15/2016 (Exact Date)    BMI 30.85 kg/m  Wt Readings from Last 3 Encounters:  02/12/20 185 lb 6.4 oz (84.1 kg)  01/05/20 178 lb 9.6 oz (81 kg)  11/17/19 182 lb 6.4 oz (82.7 kg)    Health Maintenance Due  Topic Date Due   Hepatitis C Screening  Never done    COVID-19 Vaccine (1) Never done   HIV Screening  Never done   TETANUS/TDAP  Never done   PAP SMEAR-Modifier  Never done   MAMMOGRAM  Never done  INFLUENZA VACCINE  01/14/2020    There are no preventive care reminders to display for this patient.  CBC Latest Ref Rng & Units 09/14/2017 05/27/2014  WBC 3.6 - 11.0 K/uL 9.5 8.1  Hemoglobin 12.0 - 16.0 g/dL 15.0 15.0  Hematocrit 35 - 47 % 45.9 45.2  Platelets 150 - 440 K/uL 227 220   CMP Latest Ref Rng & Units 05/27/2014  Glucose 65 - 99 mg/dL 82  BUN 7 - 18 mg/dL 10  Creatinine 0.60 - 1.30 mg/dL 0.64  Sodium 136 - 145 mmol/L 138  Potassium 3.5 - 5.1 mmol/L 3.6  Chloride 98 - 107 mmol/L 103  CO2 21 - 32 mmol/L 29  Calcium 8.5 - 10.1 mg/dL 8.1(L)    No results found for: TSH Lab Results  Component Value Date   ANIONGAP 6 (L) 05/27/2014   No results found for: CHOL, HDL, LDLCALC, CHOLHDL No results found for: TRIG No results found for: HGBA1C    ASSESSMENT & PLAN:   Problem List Items Addressed This Visit      Cardiovascular and Mediastinum   Essential hypertension - Primary    Blood pressure is stable at the present time (148/98 today)        Other   Chronic dental pain    Pt has chronic pain in the left jaw due to carries of multiple teeth. She is referred back to her dentist and evaluation. She had an appointment on Wednesday to get a tooth pulled.       Relevant Medications   traMADol (ULTRAM) 50 MG tablet   Chronic pain syndrome    Pt is seeing a pain specialist.       Relevant Medications   traMADol (ULTRAM) 50 MG tablet   Chronic bilateral low back pain without sciatica    Pt had a Fluoroscopically guided BILATERAL L3, L4, L5 medial branch blocks.        Relevant Medications   traMADol (ULTRAM) 50 MG tablet      Meds ordered this encounter  Medications   DISCONTD: traMADol (ULTRAM) 50 MG tablet    Sig: Take 2 tablets (100 mg total) by mouth 3 (three) times daily.    Dispense:  180 tablet      Refill:  0   traMADol (ULTRAM) 50 MG tablet    Sig: Take 2 tablets (100 mg total) by mouth 3 (three) times daily.    Dispense:  180 tablet    Refill:  0    Follow-up: No follow-ups on file.    Dr. Jane Canary [facility] [address], [place],  [address]   By signing my name below, I, [employer], attest that this documentation has been prepared under the direction and in the presence of Cletis Athens, MD. Electronically Signed: Cletis Athens, MD 02/12/20, 4:24 PM   I personally performed the services described in this documentation, which was SCRIBED in my presence. The recorded information has been reviewed and considered accurate. It has been edited as necessary during review. Cletis Athens, MD

## 2020-02-12 NOTE — Assessment & Plan Note (Signed)
Pt is seeing a pain specialist.

## 2020-02-12 NOTE — Assessment & Plan Note (Signed)
Pt had a Fluoroscopically guided BILATERAL L3, L4, L5 medial branch blocks.

## 2020-02-14 DIAGNOSIS — R69 Illness, unspecified: Secondary | ICD-10-CM | POA: Diagnosis not present

## 2020-02-28 DIAGNOSIS — M797 Fibromyalgia: Secondary | ICD-10-CM | POA: Diagnosis not present

## 2020-02-28 DIAGNOSIS — G8929 Other chronic pain: Secondary | ICD-10-CM | POA: Diagnosis not present

## 2020-02-28 DIAGNOSIS — Z79899 Other long term (current) drug therapy: Secondary | ICD-10-CM | POA: Diagnosis not present

## 2020-02-28 DIAGNOSIS — M5441 Lumbago with sciatica, right side: Secondary | ICD-10-CM | POA: Diagnosis not present

## 2020-02-28 DIAGNOSIS — M5442 Lumbago with sciatica, left side: Secondary | ICD-10-CM | POA: Diagnosis not present

## 2020-02-29 ENCOUNTER — Other Ambulatory Visit: Payer: Self-pay | Admitting: Internal Medicine

## 2020-02-29 DIAGNOSIS — G8929 Other chronic pain: Secondary | ICD-10-CM

## 2020-02-29 DIAGNOSIS — M5441 Lumbago with sciatica, right side: Secondary | ICD-10-CM

## 2020-03-01 ENCOUNTER — Other Ambulatory Visit: Payer: Self-pay | Admitting: Internal Medicine

## 2020-03-01 ENCOUNTER — Telehealth: Payer: Self-pay

## 2020-03-01 ENCOUNTER — Other Ambulatory Visit: Payer: Self-pay

## 2020-03-01 DIAGNOSIS — M5442 Lumbago with sciatica, left side: Secondary | ICD-10-CM

## 2020-03-01 DIAGNOSIS — G8929 Other chronic pain: Secondary | ICD-10-CM

## 2020-03-01 MED ORDER — TRAMADOL HCL 50 MG PO TABS: 100.0000 mg | ORAL_TABLET | Freq: Three times a day (TID) | ORAL | 0 refills | Status: DC

## 2020-03-01 NOTE — Telephone Encounter (Signed)
Prescription was printed and faxed for Tramadol this morning and sent to pharmacy in error. Pharmacy was notified to stop the medication from being filled and that it was done in error. Patient is currently under care of [facility]. Patient called today requesting a refill on her Tramadol. The rx was denied due to seeing pain management. Dr. Lavera Guise has documented in patient's last office visit 02/12/2020, that he will no longer be prescribing this medication and patient will need to request it from pain management.

## 2020-03-11 ENCOUNTER — Ambulatory Visit: Payer: Medicare HMO | Admitting: Internal Medicine

## 2020-03-20 DIAGNOSIS — R69 Illness, unspecified: Secondary | ICD-10-CM | POA: Diagnosis not present

## 2020-03-20 DIAGNOSIS — F419 Anxiety disorder, unspecified: Secondary | ICD-10-CM | POA: Diagnosis not present

## 2020-06-10 DIAGNOSIS — M5441 Lumbago with sciatica, right side: Secondary | ICD-10-CM | POA: Diagnosis not present

## 2020-06-10 DIAGNOSIS — M5442 Lumbago with sciatica, left side: Secondary | ICD-10-CM | POA: Diagnosis not present

## 2020-06-10 DIAGNOSIS — R69 Illness, unspecified: Secondary | ICD-10-CM | POA: Diagnosis not present

## 2020-06-10 DIAGNOSIS — R1032 Left lower quadrant pain: Secondary | ICD-10-CM | POA: Diagnosis not present

## 2020-06-10 DIAGNOSIS — G8929 Other chronic pain: Secondary | ICD-10-CM | POA: Diagnosis not present

## 2020-06-10 DIAGNOSIS — M797 Fibromyalgia: Secondary | ICD-10-CM | POA: Diagnosis not present

## 2020-09-20 DIAGNOSIS — Z79899 Other long term (current) drug therapy: Secondary | ICD-10-CM | POA: Diagnosis not present

## 2020-09-20 DIAGNOSIS — I10 Essential (primary) hypertension: Secondary | ICD-10-CM | POA: Diagnosis not present

## 2020-09-20 DIAGNOSIS — M5441 Lumbago with sciatica, right side: Secondary | ICD-10-CM | POA: Diagnosis not present

## 2020-09-20 DIAGNOSIS — R102 Pelvic and perineal pain: Secondary | ICD-10-CM | POA: Diagnosis not present

## 2020-09-20 DIAGNOSIS — M797 Fibromyalgia: Secondary | ICD-10-CM | POA: Diagnosis not present

## 2020-09-20 DIAGNOSIS — M5442 Lumbago with sciatica, left side: Secondary | ICD-10-CM | POA: Diagnosis not present

## 2020-09-20 DIAGNOSIS — G8929 Other chronic pain: Secondary | ICD-10-CM | POA: Diagnosis not present

## 2020-09-20 DIAGNOSIS — Z79891 Long term (current) use of opiate analgesic: Secondary | ICD-10-CM | POA: Diagnosis not present

## 2020-09-20 DIAGNOSIS — Z5181 Encounter for therapeutic drug level monitoring: Secondary | ICD-10-CM | POA: Diagnosis not present

## 2020-11-04 DIAGNOSIS — X501XXA Overexertion from prolonged static or awkward postures, initial encounter: Secondary | ICD-10-CM | POA: Diagnosis not present

## 2020-11-04 DIAGNOSIS — S99912A Unspecified injury of left ankle, initial encounter: Secondary | ICD-10-CM | POA: Diagnosis not present

## 2020-11-04 DIAGNOSIS — R4182 Altered mental status, unspecified: Secondary | ICD-10-CM | POA: Diagnosis not present

## 2020-11-04 DIAGNOSIS — M79672 Pain in left foot: Secondary | ICD-10-CM | POA: Diagnosis not present

## 2020-11-04 DIAGNOSIS — M25472 Effusion, left ankle: Secondary | ICD-10-CM | POA: Diagnosis not present

## 2020-11-04 DIAGNOSIS — Z20822 Contact with and (suspected) exposure to covid-19: Secondary | ICD-10-CM | POA: Diagnosis not present

## 2020-11-04 DIAGNOSIS — R4189 Other symptoms and signs involving cognitive functions and awareness: Secondary | ICD-10-CM | POA: Diagnosis not present

## 2020-11-04 DIAGNOSIS — Y33XXXA Other specified events, undetermined intent, initial encounter: Secondary | ICD-10-CM | POA: Diagnosis not present

## 2020-11-04 DIAGNOSIS — M7989 Other specified soft tissue disorders: Secondary | ICD-10-CM | POA: Diagnosis not present

## 2020-11-04 DIAGNOSIS — M25572 Pain in left ankle and joints of left foot: Secondary | ICD-10-CM | POA: Diagnosis not present

## 2020-11-04 DIAGNOSIS — M79662 Pain in left lower leg: Secondary | ICD-10-CM | POA: Diagnosis not present

## 2020-11-04 DIAGNOSIS — W108XXA Fall (on) (from) other stairs and steps, initial encounter: Secondary | ICD-10-CM | POA: Diagnosis not present

## 2020-11-05 DIAGNOSIS — S99912A Unspecified injury of left ankle, initial encounter: Secondary | ICD-10-CM | POA: Diagnosis not present

## 2020-11-18 DIAGNOSIS — R69 Illness, unspecified: Secondary | ICD-10-CM | POA: Diagnosis not present

## 2020-11-18 DIAGNOSIS — M5441 Lumbago with sciatica, right side: Secondary | ICD-10-CM | POA: Diagnosis not present

## 2020-11-18 DIAGNOSIS — R413 Other amnesia: Secondary | ICD-10-CM | POA: Diagnosis not present

## 2020-11-18 DIAGNOSIS — M5442 Lumbago with sciatica, left side: Secondary | ICD-10-CM | POA: Diagnosis not present

## 2020-11-18 DIAGNOSIS — Z5181 Encounter for therapeutic drug level monitoring: Secondary | ICD-10-CM | POA: Diagnosis not present

## 2020-11-18 DIAGNOSIS — G8929 Other chronic pain: Secondary | ICD-10-CM | POA: Diagnosis not present

## 2020-11-18 DIAGNOSIS — Z79891 Long term (current) use of opiate analgesic: Secondary | ICD-10-CM | POA: Diagnosis not present

## 2020-11-18 DIAGNOSIS — M797 Fibromyalgia: Secondary | ICD-10-CM | POA: Diagnosis not present

## 2020-11-18 DIAGNOSIS — R102 Pelvic and perineal pain: Secondary | ICD-10-CM | POA: Diagnosis not present

## 2021-01-13 DIAGNOSIS — M5441 Lumbago with sciatica, right side: Secondary | ICD-10-CM | POA: Diagnosis not present

## 2021-01-13 DIAGNOSIS — G894 Chronic pain syndrome: Secondary | ICD-10-CM | POA: Diagnosis not present

## 2021-01-13 DIAGNOSIS — G8929 Other chronic pain: Secondary | ICD-10-CM | POA: Diagnosis not present

## 2021-01-13 DIAGNOSIS — M797 Fibromyalgia: Secondary | ICD-10-CM | POA: Diagnosis not present

## 2021-01-13 DIAGNOSIS — R4189 Other symptoms and signs involving cognitive functions and awareness: Secondary | ICD-10-CM | POA: Diagnosis not present

## 2021-01-13 DIAGNOSIS — M5442 Lumbago with sciatica, left side: Secondary | ICD-10-CM | POA: Diagnosis not present

## 2021-01-13 DIAGNOSIS — R5381 Other malaise: Secondary | ICD-10-CM | POA: Diagnosis not present

## 2021-01-13 DIAGNOSIS — R232 Flushing: Secondary | ICD-10-CM | POA: Diagnosis not present

## 2021-01-13 DIAGNOSIS — R5383 Other fatigue: Secondary | ICD-10-CM | POA: Diagnosis not present

## 2021-01-13 DIAGNOSIS — I10 Essential (primary) hypertension: Secondary | ICD-10-CM | POA: Diagnosis not present

## 2021-01-17 ENCOUNTER — Other Ambulatory Visit: Payer: Self-pay | Admitting: Family Medicine

## 2021-01-17 DIAGNOSIS — Z1231 Encounter for screening mammogram for malignant neoplasm of breast: Secondary | ICD-10-CM

## 2021-03-03 DIAGNOSIS — M797 Fibromyalgia: Secondary | ICD-10-CM | POA: Diagnosis not present

## 2021-03-03 DIAGNOSIS — M5441 Lumbago with sciatica, right side: Secondary | ICD-10-CM | POA: Diagnosis not present

## 2021-03-03 DIAGNOSIS — G8929 Other chronic pain: Secondary | ICD-10-CM | POA: Diagnosis not present

## 2021-03-03 DIAGNOSIS — Z79891 Long term (current) use of opiate analgesic: Secondary | ICD-10-CM | POA: Diagnosis not present

## 2021-03-03 DIAGNOSIS — Z5181 Encounter for therapeutic drug level monitoring: Secondary | ICD-10-CM | POA: Diagnosis not present

## 2021-03-03 DIAGNOSIS — R102 Pelvic and perineal pain: Secondary | ICD-10-CM | POA: Diagnosis not present

## 2021-03-03 DIAGNOSIS — M5442 Lumbago with sciatica, left side: Secondary | ICD-10-CM | POA: Diagnosis not present

## 2021-04-21 DIAGNOSIS — E538 Deficiency of other specified B group vitamins: Secondary | ICD-10-CM | POA: Diagnosis not present

## 2021-04-21 DIAGNOSIS — M797 Fibromyalgia: Secondary | ICD-10-CM | POA: Diagnosis not present

## 2021-04-21 DIAGNOSIS — R4189 Other symptoms and signs involving cognitive functions and awareness: Secondary | ICD-10-CM | POA: Diagnosis not present

## 2021-06-02 DIAGNOSIS — M5442 Lumbago with sciatica, left side: Secondary | ICD-10-CM | POA: Diagnosis not present

## 2021-06-02 DIAGNOSIS — M797 Fibromyalgia: Secondary | ICD-10-CM | POA: Diagnosis not present

## 2021-06-02 DIAGNOSIS — G8929 Other chronic pain: Secondary | ICD-10-CM | POA: Diagnosis not present

## 2021-06-02 DIAGNOSIS — M5441 Lumbago with sciatica, right side: Secondary | ICD-10-CM | POA: Diagnosis not present

## 2021-06-02 DIAGNOSIS — M5417 Radiculopathy, lumbosacral region: Secondary | ICD-10-CM | POA: Diagnosis not present

## 2023-09-29 ENCOUNTER — Other Ambulatory Visit: Payer: Self-pay | Admitting: Family Medicine

## 2023-09-29 DIAGNOSIS — Z1231 Encounter for screening mammogram for malignant neoplasm of breast: Secondary | ICD-10-CM

## 2023-10-05 ENCOUNTER — Ambulatory Visit
Admission: RE | Admit: 2023-10-05 | Discharge: 2023-10-05 | Disposition: A | Discharge status: Home / Self Care | Source: Ambulatory Visit | Attending: Family Medicine | Admitting: Family Medicine

## 2023-10-05 DIAGNOSIS — Z1231 Encounter for screening mammogram for malignant neoplasm of breast: Secondary | ICD-10-CM | POA: Insufficient documentation

## 2023-10-15 ENCOUNTER — Other Ambulatory Visit: Payer: Self-pay | Admitting: Family Medicine

## 2023-10-15 DIAGNOSIS — R928 Other abnormal and inconclusive findings on diagnostic imaging of breast: Secondary | ICD-10-CM

## 2023-10-20 ENCOUNTER — Ambulatory Visit
Admission: RE | Admit: 2023-10-20 | Discharge: 2023-10-20 | Disposition: A | Discharge status: Home / Self Care | Source: Ambulatory Visit | Attending: Family Medicine | Admitting: Family Medicine

## 2023-10-20 DIAGNOSIS — R928 Other abnormal and inconclusive findings on diagnostic imaging of breast: Secondary | ICD-10-CM | POA: Insufficient documentation

## 2023-10-22 ENCOUNTER — Other Ambulatory Visit: Payer: Self-pay | Admitting: Family Medicine

## 2023-10-22 DIAGNOSIS — R928 Other abnormal and inconclusive findings on diagnostic imaging of breast: Secondary | ICD-10-CM

## 2023-10-25 ENCOUNTER — Encounter: Payer: Self-pay | Admitting: Family Medicine

## 2023-10-25 DIAGNOSIS — R928 Other abnormal and inconclusive findings on diagnostic imaging of breast: Secondary | ICD-10-CM

## 2023-10-25 DIAGNOSIS — R921 Mammographic calcification found on diagnostic imaging of breast: Secondary | ICD-10-CM

## 2023-10-25 DIAGNOSIS — N63 Unspecified lump in unspecified breast: Secondary | ICD-10-CM

## 2023-11-03 ENCOUNTER — Ambulatory Visit
Admission: RE | Admit: 2023-11-03 | Discharge: 2023-11-03 | Disposition: A | Discharge status: Home / Self Care | Source: Ambulatory Visit | Attending: Family Medicine | Admitting: Family Medicine

## 2023-11-03 DIAGNOSIS — R928 Other abnormal and inconclusive findings on diagnostic imaging of breast: Secondary | ICD-10-CM | POA: Insufficient documentation

## 2023-11-03 DIAGNOSIS — D241 Benign neoplasm of right breast: Secondary | ICD-10-CM | POA: Insufficient documentation

## 2023-11-03 DIAGNOSIS — D242 Benign neoplasm of left breast: Secondary | ICD-10-CM | POA: Insufficient documentation

## 2023-11-03 MED ORDER — LIDOCAINE 1 % OPTIME INJ - NO CHARGE
5.0000 mL | Freq: Once | INTRAMUSCULAR | Status: AC
  Administered 2023-11-03: 5 mL via INTRADERMAL
  Filled 2023-11-03: qty 6

## 2023-11-03 MED ORDER — LIDOCAINE-EPINEPHRINE 1 %-1:100000 IJ SOLN
10.0000 mL | Freq: Once | INTRAMUSCULAR | Status: AC
  Administered 2023-11-03: 10 mL via INTRADERMAL
  Filled 2023-11-03: qty 10

## 2023-11-04 ENCOUNTER — Encounter: Payer: Self-pay | Admitting: *Deleted

## 2023-11-04 LAB — SURGICAL PATHOLOGY

## 2023-11-04 NOTE — Progress Notes (Signed)
 Referral recieved from [facility] for benign breast mass. She will see Dr. Rosea Conch on 5/27 at 8:00am. No further needs at this time

## 2023-11-09 ENCOUNTER — Ambulatory Visit: Payer: Self-pay | Admitting: Surgery

## 2023-11-09 ENCOUNTER — Other Ambulatory Visit: Payer: Self-pay | Admitting: Surgery

## 2023-11-09 DIAGNOSIS — D241 Benign neoplasm of right breast: Secondary | ICD-10-CM

## 2023-11-09 NOTE — H&P (View-Only) (Signed)
 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 female who was referred by Araceli Beady, MD for evaluation of above. Change was noted on last screening mammogram. Patient does not routinely do self breast exams.  Patient denies hormonal therapy. Patient is G3P1. Age of first live birth was 29. Patient did not breast feed. Patient denies nipple discharge. Patient reports previous breast biopsy, benign. Patient denies a personal history of breast cancer, but strong family history  2PPD current smoker   Past Medical History:  has a past medical history of Anxiety, Arthritis, Depression, Dysuria, Endometriosis of uterus, Fibromyalgia, GERD (gastroesophageal reflux disease), Heart murmur, Hyperlipidemia, Hypertension, Low back pain, Obesity, Osteoporosis, Sciatica, and Tobacco use.  Past Surgical History:  has a past surgical history that includes Laparoscopic tubal ligation; Dilation and curettage, diagnostic / therapeutic; Removal Ovarian Cyst; Dilation and curettage of uterus; Tubal ligation; and removal of ovarian cyst.  Family History: family history includes Breast cancer in her mother; Colon cancer in her father; Coronary Artery Disease (Blocked arteries around heart) (age of onset: 3) in her mother; Dementia in her father; Heart failure in her mother; Hyperlipidemia (Elevated cholesterol) in her father and mother; Kidney disease in her maternal aunt; Leukemia in her father; Recurrent infections in her brother; Stroke (age of onset: 69) in her father; Thyroid  disease in her brother, maternal grandfather, mother, and sister.  Social History:  reports that she has been smoking cigarettes. She has never used smokeless tobacco. She reports that she does not currently use alcohol. She reports that she does not use drugs.  Current Medications: has a current medication list which includes the following prescription(s): aspirin/salicylamide/caffeine,  buprenorphine hcl, cholecalciferol, ergocalciferol (vitamin d2), gabapentin , losartan-hydrochlorothiazide, rosuvastatin, memantine, and naloxone.  Allergies:  Allergies as of 11/09/2023 - Reviewed 11/09/2023  Allergen Reaction Noted   Sulfa (sulfonamide antibiotics) Shortness Of Breath 09/14/2013    ROS:  A 15 point review of systems was performed and was negative except as noted in HPI   Objective:     BP 113/72   Pulse 83   Ht 162.6 cm (5\' 4" )   Wt 78.5 kg (173 lb)   LMP 07/16/2016   BMI 29.70 kg/m   Constitutional :  No distress, cooperative, alert  Lymphatics/Throat:  Supple with no lymphadenopathy  Respiratory:  Clear to auscultation bilaterally  Cardiovascular:  Regular rate and rhythm  Gastrointestinal: Soft, non-tender, non-distended, no organomegaly.  Musculoskeletal: Steady gait and movement  Skin: Cool and moist  Psychiatric: Normal affect, non-agitated, not confused  Breast: Normal appearance and no palpable abnormality in left breast and axilla. Bruising in right from recent biopsy, no lymphadenopathy. Chaperone present for exam.      LABS:  Component 6 d ago  SURGICAL PATHOLOGY SURGICAL PATHOLOGY [facility] [address] [place], [place] [address] Telephone [phone] or [phone] Fax [phone]  REPORT OF SURGICAL PATHOLOGY   Accession #: [id number] Patient Name: Andrea Frazier Visit # : [id number]  MRN: [id number] Physician: Clancy Crimes DOB/Age [birth year] (Age: 24) Gender: F Collected Date: 11/03/2023 Received Date: 11/03/2023  FINAL DIAGNOSIS       1. 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      NEGATIVE FOR ATYPIA OR MALIGNANCY.  SEE NOTE.       2. 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      NEGATIVE FOR ATYPIA OR MALIGNANCY.       Diagnosis Note : Complete excision is recommended.Part 1/A of the case was      peer-reviewed by Dr.Rubinas who agrees with the diagnosis.      ELECTRONIC SIGNATURE : Dillard Frame Md, Pathologist, Electronic Signature  MICROSCOPIC DESCRIPTION  CASE COMMENTS STAINS USED IN DIAGNOSIS: H&E-2 H&E-3 H&E-4 H&E H&E-2 H&E-3 H&E-4 H&E    CLINICAL HISTORY  SPECIMEN(S) OBTAINED 1. Breast, right, needle core biopsy, 9 O'clock, 5cmfn, Ribbon 2. Breast, left, needle core biopsy, 11 O'clock, 5cmfn, Coil  SPECIMEN COMMENTS: 1. TIF: 8:37 AM, CIT less than 30 sec; new baseline mammo; indeterminate mass with calcifications, possibly complex cystic and solid 2. TIF: 8:55 AM, CIT less than 30 sec; oval mass SPECIMEN CLINICAL INFORMATION: 1. Complex FA, papilloma, malignancy 2. Fibroadenoma, FCC, papilloma, malignancy    Gross Description 1. Received in formalin in a jar labeled rt breast 9:00 5cmfn are 4 cores of tan, fibrotic tissue aggregating to 3.9 x 0.2 x 0.2 cm submitted entirely in 1 block.      CIT: <30 seconds      TIF: 0837 2. Received in formalin in a jar labeled lt breast 11:00 5cmfn are multiple fragments of tan-yellow fibrofatty tissue aggregating to 1.1 x 0.4 x 0.2 cm submitted entirely in 1 block.      CIT: <30 seconds      TIF: 0855      mb 11-03-23        Report signed out from the following location(s) [place] [address] [place], [place] [address] CLIA #: [id number]  [facility] [address] [place], [place] [address] CLIA #: [id number]   Addendum by Graylin Lea, MD on 11/04/2023  3:24 PM EDT  ADDENDUM REPORT: 11/04/2023 13:24   ADDENDUM:  PATHOLOGY revealed: Site 1. Breast, right, needle core biopsy, 9  o'clock, 5 cmfn, ribbon. - SCLEROSED INTRADUCTAL PAPILLOMA (1.0 CM /  10 MM) WITH SCLEROSING ADENOSIS, USUAL DUCTAL  HYPERPLASIA, APOCRINE  METAPLASIA AND CALCIFICATIONS. NEGATIVE FOR ATYPIA OR MALIGNANCY.  SEE NOTE. Diagnosis Note 1. Complete excision is recommended. Part  1/A of the case was peer-reviewed by Dr. Brunetta Capes who agrees with the  diagnosis.   Pathology results are CONCORDANT with imaging findings, per Dr.  Clancy Crimes with excision recommended.   PATHOLOGY revealed: Site 2. Breast, left, needle core biopsy, 11  o'clock, 5 cmfn, coil. 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 NEGATIVE FOR ATYPIA  OR MALIGNANCY.   Pathology results are CONCORDANT with imaging findings, per Dr.  Clancy Crimes.   Pathology results and recommendations below were discussed with  patient by telephone on 11/04/23 by Ladonna Pickup RN. Patient reported  biopsy site within normal limits with slight tenderness at the site.  Post biopsy care instructions were reviewed, questions were answered  and my direct phone number was provided to patient. Patient was  instructed to call [facility] if any concerns or  questions arise related to the biopsy.   RECOMMENDATION: Surgical consultation for consideration of excision  of Site 1 only. Request for surgical consultation relayed to Gwenn Lenz RN at [facility] by Ladonna Pickup RN on  11/04/23.   Pathology results reported by Ladonna Pickup RN on 11/04/2023.  Electronically Signed    By: Clancy Crimes M.D.    On: 11/04/2023 13:24   RADS: CLINICAL DATA:  Bilateral callback   EXAM:  DIGITAL DIAGNOSTIC BILATERAL MAMMOGRAM WITH TOMOSYNTHESIS AND CAD;  ULTRASOUND RIGHT BREAST LIMITED; ULTRASOUND LEFT BREAST LIMITED   TECHNIQUE:  Bilateral digital diagnostic mammography and breast tomosynthesis  was performed. The images were evaluated with computer-aided  detection. ; Targeted ultrasound examination of the right breast was  performed; Targeted ultrasound examination of the left breast was  performed.   COMPARISON: Previous exam(s).   ACR  Breast Density Category c: The breasts are heterogeneously  dense, which may obscure small masses.   FINDINGS:  Spot magnification views of the RIGHT breast demonstrate an oval  obscured mass with associated amorphous and punctate calcifications  in the RIGHT outer breast at middle depth. It measures approximately  12 mm.   Spot compression tomosynthesis views demonstrates a persistent  low-density oval mass in the LEFT upper inner breast at middle to  posterior depth.   On physical exam, no suspicious mass is appreciated in the LEFT  breast.   Targeted ultrasound was performed of the LEFT breast. At 11 o'clock  5 cm from the nipple, there is an oval circumscribed hypoechoic to  near anechoic mass with suggestion of posterior acoustic  enhancement. It measures 8 x 6 x 2 mm. This is favored to correspond  to the site of mammographic concern.   Targeted ultrasound was performed of the LEFT axilla. No suspicious  axillary lymph nodes are visualized.   Targeted ultrasound was performed of the RIGHT breast. At 9 o'clock  5 cm from the nipple, there is an oval mass with irregular versus  microlobulated margins. It is hypoechoic in appearance with multiple  internal echogenic foci as well as a small anechoic component. Mass  measures 12 x 7 x 15 mm and corresponds to the site of screening  mammographic concern.   Targeted ultrasound was performed of the RIGHT axilla. No suspicious  axillary lymph nodes are visualized.   IMPRESSION:  1. There is an indeterminate possible complex cystic and solid 15 mm  mass with associated calcifications in the RIGHT breast. Recommend  ultrasound-guided biopsy for definitive characterization.  2. There is a probably benign 8 mm LEFT breast mass which may  reflect a complicated cyst. Option for short-term follow-up versus  definitive characterization with ultrasound-guided aspiration with  potential conversion to biopsy was discussed with patient.  Patient  would prefer to proceed with definitive characterization at this  point in time. As such, recommend LEFT breast ultrasound-guided  aspiration with potential conversion to biopsy.  3. No suspicious axillary adenopathy bilaterally.   RECOMMENDATION:  1. RIGHT breast ultrasound-guided biopsy x1  2. LEFT breast ultrasound-guided aspiration with potential  conversion to biopsy x1   I have discussed the findings and recommendations with the patient.  The biopsy procedure was discussed with the patient and questions  were answered. Patient expressed their understanding of the biopsy  recommendation. Patient will be scheduled for biopsy at her earliest  convenience by the schedulers. Ordering provider will be notified.  If applicable, a reminder letter will be sent to the patient  regarding the next appointment.   BI-RADS CATEGORY  4: Suspicious.    Electronically Signed    By: Clancy Crimes M.D.    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 The risks of general anesthetic, if used, includes MI, CVA, sudden death or even reaction to anesthetic medications also discussed.  Typical post-op recovery time and possbility of activity restrictions were also discussed.  Alternatives include continued observation.  Benefits include possible symptom relief, pathologic evaluation, and/or curative excision.   The patient verbalized understanding and all questions were answered to the patient's satisfaction.  2. Patient has elected to proceed with surgical treatment. Procedure will be  scheduled.    Hold goody's 7 days Encouraged smoking cessation. Also need to coordinate with pain clinic re: post op pain management.  labs/images/medications/previous chart entries reviewed personally and relevant changes/updates noted above.

## 2023-11-09 NOTE — H&P (Signed)
 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 female who was referred by Araceli Beady, MD for evaluation of above. Change was noted on last screening mammogram. Patient does not routinely do self breast exams.  Patient denies hormonal therapy. Patient is G3P1. Age of first live birth was 29. Patient did not breast feed. Patient denies nipple discharge. Patient reports previous breast biopsy, benign. Patient denies a personal history of breast cancer, but strong family history  2PPD current smoker   Past Medical History:  has a past medical history of Anxiety, Arthritis, Depression, Dysuria, Endometriosis of uterus, Fibromyalgia, GERD (gastroesophageal reflux disease), Heart murmur, Hyperlipidemia, Hypertension, Low back pain, Obesity, Osteoporosis, Sciatica, and Tobacco use.  Past Surgical History:  has a past surgical history that includes Laparoscopic tubal ligation; Dilation and curettage, diagnostic / therapeutic; Removal Ovarian Cyst; Dilation and curettage of uterus; Tubal ligation; and removal of ovarian cyst.  Family History: family history includes Breast cancer in her mother; Colon cancer in her father; Coronary Artery Disease (Blocked arteries around heart) (age of onset: 3) in her mother; Dementia in her father; Heart failure in her mother; Hyperlipidemia (Elevated cholesterol) in her father and mother; Kidney disease in her maternal aunt; Leukemia in her father; Recurrent infections in her brother; Stroke (age of onset: 69) in her father; Thyroid  disease in her brother, maternal grandfather, mother, and sister.  Social History:  reports that she has been smoking cigarettes. She has never used smokeless tobacco. She reports that she does not currently use alcohol. She reports that she does not use drugs.  Current Medications: has a current medication list which includes the following prescription(s): aspirin/salicylamide/caffeine,  buprenorphine hcl, cholecalciferol, ergocalciferol (vitamin d2), gabapentin , losartan-hydrochlorothiazide, rosuvastatin, memantine, and naloxone.  Allergies:  Allergies as of 11/09/2023 - Reviewed 11/09/2023  Allergen Reaction Noted   Sulfa (sulfonamide antibiotics) Shortness Of Breath 09/14/2013    ROS:  A 15 point review of systems was performed and was negative except as noted in HPI   Objective:     BP 113/72   Pulse 83   Ht 162.6 cm (5\' 4" )   Wt 78.5 kg (173 lb)   LMP 07/16/2016   BMI 29.70 kg/m   Constitutional :  No distress, cooperative, alert  Lymphatics/Throat:  Supple with no lymphadenopathy  Respiratory:  Clear to auscultation bilaterally  Cardiovascular:  Regular rate and rhythm  Gastrointestinal: Soft, non-tender, non-distended, no organomegaly.  Musculoskeletal: Steady gait and movement  Skin: Cool and moist  Psychiatric: Normal affect, non-agitated, not confused  Breast: Normal appearance and no palpable abnormality in left breast and axilla. Bruising in right from recent biopsy, no lymphadenopathy. Chaperone present for exam.      LABS:  Component 6 d ago  SURGICAL PATHOLOGY SURGICAL PATHOLOGY [facility] [address] [place], [place] [address] Telephone [phone] or [phone] Fax [phone]  REPORT OF SURGICAL PATHOLOGY   Accession #: [id number] Patient Name: Andrea Frazier Visit # : [id number]  MRN: [id number] Physician: Clancy Crimes DOB/Age [birth year] (Age: 24) Gender: F Collected Date: 11/03/2023 Received Date: 11/03/2023  FINAL DIAGNOSIS       1. 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      NEGATIVE FOR ATYPIA OR MALIGNANCY.  SEE NOTE.       2. 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      NEGATIVE FOR ATYPIA OR MALIGNANCY.       Diagnosis Note : Complete excision is recommended.Part 1/A of the case was      peer-reviewed by Dr.Rubinas who agrees with the diagnosis.      ELECTRONIC SIGNATURE : Dillard Frame Md, Pathologist, Electronic Signature  MICROSCOPIC DESCRIPTION  CASE COMMENTS STAINS USED IN DIAGNOSIS: H&E-2 H&E-3 H&E-4 H&E H&E-2 H&E-3 H&E-4 H&E    CLINICAL HISTORY  SPECIMEN(S) OBTAINED 1. Breast, right, needle core biopsy, 9 O'clock, 5cmfn, Ribbon 2. Breast, left, needle core biopsy, 11 O'clock, 5cmfn, Coil  SPECIMEN COMMENTS: 1. TIF: 8:37 AM, CIT less than 30 sec; new baseline mammo; indeterminate mass with calcifications, possibly complex cystic and solid 2. TIF: 8:55 AM, CIT less than 30 sec; oval mass SPECIMEN CLINICAL INFORMATION: 1. Complex FA, papilloma, malignancy 2. Fibroadenoma, FCC, papilloma, malignancy    Gross Description 1. Received in formalin in a jar labeled rt breast 9:00 5cmfn are 4 cores of tan, fibrotic tissue aggregating to 3.9 x 0.2 x 0.2 cm submitted entirely in 1 block.      CIT: <30 seconds      TIF: 0837 2. Received in formalin in a jar labeled lt breast 11:00 5cmfn are multiple fragments of tan-yellow fibrofatty tissue aggregating to 1.1 x 0.4 x 0.2 cm submitted entirely in 1 block.      CIT: <30 seconds      TIF: 0855      mb 11-03-23        Report signed out from the following location(s) [place] [address] [place], [place] [address] CLIA #: [id number]  [facility] [address] [place], [place] [address] CLIA #: [id number]   Addendum by Graylin Lea, MD on 11/04/2023  3:24 PM EDT  ADDENDUM REPORT: 11/04/2023 13:24   ADDENDUM:  PATHOLOGY revealed: Site 1. Breast, right, needle core biopsy, 9  o'clock, 5 cmfn, ribbon. - SCLEROSED INTRADUCTAL PAPILLOMA (1.0 CM /  10 MM) WITH SCLEROSING ADENOSIS, USUAL DUCTAL  HYPERPLASIA, APOCRINE  METAPLASIA AND CALCIFICATIONS. NEGATIVE FOR ATYPIA OR MALIGNANCY.  SEE NOTE. Diagnosis Note 1. Complete excision is recommended. Part  1/A of the case was peer-reviewed by Dr. Brunetta Capes who agrees with the  diagnosis.   Pathology results are CONCORDANT with imaging findings, per Dr.  Clancy Crimes with excision recommended.   PATHOLOGY revealed: Site 2. Breast, left, needle core biopsy, 11  o'clock, 5 cmfn, coil. 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 NEGATIVE FOR ATYPIA  OR MALIGNANCY.   Pathology results are CONCORDANT with imaging findings, per Dr.  Clancy Crimes.   Pathology results and recommendations below were discussed with  patient by telephone on 11/04/23 by Ladonna Pickup RN. Patient reported  biopsy site within normal limits with slight tenderness at the site.  Post biopsy care instructions were reviewed, questions were answered  and my direct phone number was provided to patient. Patient was  instructed to call [facility] if any concerns or  questions arise related to the biopsy.   RECOMMENDATION: Surgical consultation for consideration of excision  of Site 1 only. Request for surgical consultation relayed to Gwenn Lenz RN at [facility] by Ladonna Pickup RN on  11/04/23.   Pathology results reported by Ladonna Pickup RN on 11/04/2023.  Electronically Signed    By: Clancy Crimes M.D.    On: 11/04/2023 13:24   RADS: CLINICAL DATA:  Bilateral callback   EXAM:  DIGITAL DIAGNOSTIC BILATERAL MAMMOGRAM WITH TOMOSYNTHESIS AND CAD;  ULTRASOUND RIGHT BREAST LIMITED; ULTRASOUND LEFT BREAST LIMITED   TECHNIQUE:  Bilateral digital diagnostic mammography and breast tomosynthesis  was performed. The images were evaluated with computer-aided  detection. ; Targeted ultrasound examination of the right breast was  performed; Targeted ultrasound examination of the left breast was  performed.   COMPARISON: Previous exam(s).   ACR  Breast Density Category c: The breasts are heterogeneously  dense, which may obscure small masses.   FINDINGS:  Spot magnification views of the RIGHT breast demonstrate an oval  obscured mass with associated amorphous and punctate calcifications  in the RIGHT outer breast at middle depth. It measures approximately  12 mm.   Spot compression tomosynthesis views demonstrates a persistent  low-density oval mass in the LEFT upper inner breast at middle to  posterior depth.   On physical exam, no suspicious mass is appreciated in the LEFT  breast.   Targeted ultrasound was performed of the LEFT breast. At 11 o'clock  5 cm from the nipple, there is an oval circumscribed hypoechoic to  near anechoic mass with suggestion of posterior acoustic  enhancement. It measures 8 x 6 x 2 mm. This is favored to correspond  to the site of mammographic concern.   Targeted ultrasound was performed of the LEFT axilla. No suspicious  axillary lymph nodes are visualized.   Targeted ultrasound was performed of the RIGHT breast. At 9 o'clock  5 cm from the nipple, there is an oval mass with irregular versus  microlobulated margins. It is hypoechoic in appearance with multiple  internal echogenic foci as well as a small anechoic component. Mass  measures 12 x 7 x 15 mm and corresponds to the site of screening  mammographic concern.   Targeted ultrasound was performed of the RIGHT axilla. No suspicious  axillary lymph nodes are visualized.   IMPRESSION:  1. There is an indeterminate possible complex cystic and solid 15 mm  mass with associated calcifications in the RIGHT breast. Recommend  ultrasound-guided biopsy for definitive characterization.  2. There is a probably benign 8 mm LEFT breast mass which may  reflect a complicated cyst. Option for short-term follow-up versus  definitive characterization with ultrasound-guided aspiration with  potential conversion to biopsy was discussed with patient.  Patient  would prefer to proceed with definitive characterization at this  point in time. As such, recommend LEFT breast ultrasound-guided  aspiration with potential conversion to biopsy.  3. No suspicious axillary adenopathy bilaterally.   RECOMMENDATION:  1. RIGHT breast ultrasound-guided biopsy x1  2. LEFT breast ultrasound-guided aspiration with potential  conversion to biopsy x1   I have discussed the findings and recommendations with the patient.  The biopsy procedure was discussed with the patient and questions  were answered. Patient expressed their understanding of the biopsy  recommendation. Patient will be scheduled for biopsy at her earliest  convenience by the schedulers. Ordering provider will be notified.  If applicable, a reminder letter will be sent to the patient  regarding the next appointment.   BI-RADS CATEGORY  4: Suspicious.    Electronically Signed    By: Clancy Crimes M.D.    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 The risks of general anesthetic, if used, includes MI, CVA, sudden death or even reaction to anesthetic medications also discussed.  Typical post-op recovery time and possbility of activity restrictions were also discussed.  Alternatives include continued observation.  Benefits include possible symptom relief, pathologic evaluation, and/or curative excision.   The patient verbalized understanding and all questions were answered to the patient's satisfaction.  2. Patient has elected to proceed with surgical treatment. Procedure will be  scheduled.    Hold goody's 7 days Encouraged smoking cessation. Also need to coordinate with pain clinic re: post op pain management.  labs/images/medications/previous chart entries reviewed personally and relevant changes/updates noted above.

## 2023-11-11 ENCOUNTER — Ambulatory Visit
Admission: RE | Admit: 2023-11-11 | Discharge: 2023-11-11 | Disposition: A | Discharge status: Home / Self Care | Source: Ambulatory Visit | Attending: Surgery | Admitting: Surgery

## 2023-11-11 ENCOUNTER — Inpatient Hospital Stay: Admission: RE | Admit: 2023-11-11 | Source: Ambulatory Visit

## 2023-11-11 DIAGNOSIS — D241 Benign neoplasm of right breast: Secondary | ICD-10-CM

## 2023-11-11 MED ORDER — LIDOCAINE HCL 1 % IJ SOLN
5.0000 mL | Freq: Once | INTRAMUSCULAR | Status: AC
  Administered 2023-11-11: 5 mL

## 2023-11-17 ENCOUNTER — Encounter
Admission: RE | Admit: 2023-11-17 | Discharge: 2023-11-17 | Disposition: A | Discharge status: Home / Self Care | Source: Ambulatory Visit | Attending: Surgery | Admitting: Surgery

## 2023-11-17 ENCOUNTER — Other Ambulatory Visit: Payer: Self-pay

## 2023-11-17 VITALS — Ht 65.0 in | Wt 173.0 lb

## 2023-11-17 DIAGNOSIS — Z01810 Encounter for preprocedural cardiovascular examination: Secondary | ICD-10-CM

## 2023-11-17 DIAGNOSIS — Z01812 Encounter for preprocedural laboratory examination: Secondary | ICD-10-CM

## 2023-11-17 DIAGNOSIS — K760 Fatty (change of) liver, not elsewhere classified: Secondary | ICD-10-CM

## 2023-11-17 DIAGNOSIS — R011 Cardiac murmur, unspecified: Secondary | ICD-10-CM

## 2023-11-17 DIAGNOSIS — I10 Essential (primary) hypertension: Secondary | ICD-10-CM

## 2023-11-17 HISTORY — DX: Fatty (change of) liver, not elsewhere classified: K76.0

## 2023-11-17 HISTORY — DX: Chronic obstructive pulmonary disease, unspecified: J44.9

## 2023-11-17 HISTORY — DX: Essential (primary) hypertension: I10

## 2023-11-17 HISTORY — DX: Family history of other specified conditions: Z84.89

## 2023-11-17 HISTORY — DX: Hyperlipidemia, unspecified: E78.5

## 2023-11-17 HISTORY — DX: Dementia in other diseases classified elsewhere, unspecified severity, without behavioral disturbance, psychotic disturbance, mood disturbance, and anxiety: F02.80

## 2023-11-17 HISTORY — DX: Chronic pain syndrome: G89.4

## 2023-11-17 HISTORY — DX: Nicotine dependence, cigarettes, uncomplicated: F17.210

## 2023-11-17 HISTORY — DX: Vitamin D deficiency, unspecified: E55.9

## 2023-11-17 HISTORY — DX: Benign neoplasm of right breast: D24.1

## 2023-11-17 HISTORY — DX: Cardiac murmur, unspecified: R01.1

## 2023-11-17 MED ORDER — FENTANYL CITRATE (PF) 100 MCG/2ML IJ SOLN
INTRAMUSCULAR | Status: AC
Start: 2023-11-17 — End: ?
  Filled 2023-11-17: qty 2

## 2023-11-17 NOTE — Patient Instructions (Addendum)
 Your procedure is scheduled on:11-19-23 Friday Report to the Registration Desk on the 1st floor of the Medical Mall.Then proceed to the 2nd floor Surgery Desk To find out your arrival time, please call [phone] between 1PM - 3PM on:11-18-23 Thursday If your arrival time is 6:00 am, do not arrive before that time as the Medical Mall entrance doors do not open until 6:00 am.  REMEMBER: Instructions that are not followed completely may result in serious medical risk, up to and including death; or upon the discretion of your surgeon and anesthesiologist your surgery may need to be rescheduled.  Do not eat food after midnight the night before surgery.  No gum chewing or hard candies.  You may however, drink CLEAR liquids up to 2 hours before you are scheduled to arrive for your surgery. Do not drink anything within 2 hours of your scheduled arrival time.  Clear liquids include: - water  - apple juice without pulp - gatorade (not RED colors) - black coffee or tea (Do NOT add milk or creamers to the coffee or tea) Do NOT drink anything that is not on this list.  One week prior to surgery:Stop NOW (11-17-23) Stop Anti-inflammatories (NSAIDS) such as Advil, Aleve, Ibuprofen, Motrin, Naproxen, Naprosyn and Aspirin based products such as Excedrin, Goody's Powder, BC Powder. Stop ANY OVER THE COUNTER supplements until after surgery.  You may however, continue to take Tylenol if needed for pain up until the day of surgery.  Continue taking all of your other prescription medications up until the day of surgery.  ON THE DAY OF SURGERY ONLY TAKE THESE MEDICATIONS WITH SIPS OF WATER: -gabapentin  (NEURONTIN )   No Alcohol for 24 hours before or after surgery.  No Smoking including e-cigarettes for 24 hours before surgery.  No chewable tobacco products for at least 6 hours before surgery.  No nicotine patches on the day of surgery.  Do not use any "recreational" drugs for at least a week (preferably  2 weeks) before your surgery.  Please be advised that the combination of cocaine and anesthesia may have negative outcomes, up to and including death. If you test positive for cocaine, your surgery will be cancelled.  On the morning of surgery brush your teeth with toothpaste and water, you may rinse your mouth with mouthwash if you wish. Do not swallow any toothpaste or mouthwash.  Use CHG Soap as directed on instruction sheet.  Do not wear jewelry, make-up, hairpins, clips or nail polish.  For welded (permanent) jewelry: bracelets, anklets, waist bands, etc.  Please have this removed prior to surgery.  If it is not removed, there is a chance that hospital personnel will need to cut it off on the day of surgery.  Do not wear lotions, powders, or perfumes.   Do not shave body hair from the neck down 48 hours before surgery.  Contact lenses, hearing aids and dentures may not be worn into surgery.  Do not bring valuables to the hospital. [facility] is not responsible for any missing/lost belongings or valuables.   Notify your doctor if there is any change in your medical condition (cold, fever, infection).  Wear comfortable clothing (specific to your surgery type) to the hospital.  After surgery, you can help prevent lung complications by doing breathing exercises.  Take deep breaths and cough every 1-2 hours. Your doctor may order a device called an Incentive Spirometer to help you take deep breaths. When coughing or sneezing, hold a pillow firmly against your incision  with both hands. This is called "splinting." Doing this helps protect your incision. It also decreases belly discomfort.  If you are being admitted to the hospital overnight, leave your suitcase in the car. After surgery it may be brought to your room.  In case of increased patient census, it may be necessary for you, the patient, to continue your postoperative care in the Same Day Surgery department.  If you are being  discharged the day of surgery, you will not be allowed to drive home. You will need a responsible individual to drive you home and stay with you for 24 hours after surgery.   If you are taking public transportation, you will need to have a responsible individual with you.  Please call the Pre-admissions Testing Dept. at [phone] if you have any questions about these instructions.  Surgery Visitation Policy:  Patients having surgery or a procedure may have two visitors.  Children under the age of 49 must have an adult with them who is not the patient.     Preparing for Surgery with CHLORHEXIDINE GLUCONATE (CHG) Soap  Chlorhexidine Gluconate (CHG) Soap  o An antiseptic cleaner that kills germs and bonds with the skin to continue killing germs even after washing  o Used for showering the night before surgery and morning of surgery  Before surgery, you can play an important role by reducing the number of germs on your skin.  CHG (Chlorhexidine gluconate) soap is an antiseptic cleanser which kills germs and bonds with the skin to continue killing germs even after washing.  Please do not use if you have an allergy to CHG or antibacterial soaps. If your skin becomes reddened/irritated stop using the CHG.  1. Shower the NIGHT BEFORE SURGERY and the MORNING OF SURGERY with CHG soap.  2. If you choose to wash your hair, wash your hair first as usual with your normal shampoo.  3. After shampooing, rinse your hair and body thoroughly to remove the shampoo.  4. Use CHG as you would any other liquid soap. You can apply CHG directly to the skin and wash gently with a scrungie or a clean washcloth.  5. Apply the CHG soap to your body only from the neck down. Do not use on open wounds or open sores. Avoid contact with your eyes, ears, mouth, and genitals (private parts). Wash face and genitals (private parts) with your normal soap.  6. Wash thoroughly, paying special attention to the area  where your surgery will be performed.  7. Thoroughly rinse your body with warm water.  8. Do not shower/wash with your normal soap after using and rinsing off the CHG soap.  9. Pat yourself dry with a clean towel.  10. Wear clean pajamas to bed the night before surgery.  12. Place clean sheets on your bed the night of your first shower and do not sleep with pets.  13. Shower again with the CHG soap on the day of surgery prior to arriving at the hospital.  14. Do not apply any deodorants/lotions/powders.  15. Please wear clean clothes to the hospital.

## 2023-11-18 ENCOUNTER — Telehealth: Payer: Self-pay | Admitting: Urgent Care

## 2023-11-18 ENCOUNTER — Encounter
Admission: RE | Admit: 2023-11-18 | Discharge: 2023-11-18 | Disposition: A | Discharge status: Home / Self Care | Source: Ambulatory Visit | Attending: Surgery | Admitting: Surgery

## 2023-11-18 DIAGNOSIS — E876 Hypokalemia: Secondary | ICD-10-CM | POA: Diagnosis not present

## 2023-11-18 DIAGNOSIS — Z79899 Other long term (current) drug therapy: Secondary | ICD-10-CM | POA: Diagnosis not present

## 2023-11-18 DIAGNOSIS — D241 Benign neoplasm of right breast: Secondary | ICD-10-CM

## 2023-11-18 DIAGNOSIS — R011 Cardiac murmur, unspecified: Secondary | ICD-10-CM | POA: Insufficient documentation

## 2023-11-18 DIAGNOSIS — Z01812 Encounter for preprocedural laboratory examination: Secondary | ICD-10-CM

## 2023-11-18 DIAGNOSIS — T502X5A Adverse effect of carbonic-anhydrase inhibitors, benzothiadiazides and other diuretics, initial encounter: Secondary | ICD-10-CM | POA: Diagnosis not present

## 2023-11-18 DIAGNOSIS — I10 Essential (primary) hypertension: Secondary | ICD-10-CM | POA: Insufficient documentation

## 2023-11-18 DIAGNOSIS — K760 Fatty (change of) liver, not elsewhere classified: Secondary | ICD-10-CM | POA: Insufficient documentation

## 2023-11-18 DIAGNOSIS — Z01810 Encounter for preprocedural cardiovascular examination: Secondary | ICD-10-CM

## 2023-11-18 DIAGNOSIS — Z01818 Encounter for other preprocedural examination: Secondary | ICD-10-CM | POA: Diagnosis present

## 2023-11-18 LAB — CBC
HCT: 46.4 % — ABNORMAL HIGH (ref 36.0–46.0)
Hemoglobin: 15.6 g/dL — ABNORMAL HIGH (ref 12.0–15.0)
MCH: 30.4 pg (ref 26.0–34.0)
MCHC: 33.6 g/dL (ref 30.0–36.0)
MCV: 90.3 fL (ref 80.0–100.0)
Platelets: 186 10*3/uL (ref 150–400)
RBC: 5.14 MIL/uL — ABNORMAL HIGH (ref 3.87–5.11)
RDW: 12.4 % (ref 11.5–15.5)
WBC: 6.7 10*3/uL (ref 4.0–10.5)
nRBC: 0 % (ref 0.0–0.2)

## 2023-11-18 LAB — COMPREHENSIVE METABOLIC PANEL WITH GFR
ALT: 16 U/L (ref 0–44)
AST: 14 U/L — ABNORMAL LOW (ref 15–41)
Albumin: 3.8 g/dL (ref 3.5–5.0)
Alkaline Phosphatase: 63 U/L (ref 38–126)
Anion gap: 13 (ref 5–15)
BUN: 11 mg/dL (ref 6–20)
CO2: 27 mmol/L (ref 22–32)
Calcium: 9.3 mg/dL (ref 8.9–10.3)
Chloride: 97 mmol/L — ABNORMAL LOW (ref 98–111)
Creatinine, Ser: 0.52 mg/dL (ref 0.44–1.00)
GFR, Estimated: >60 mL/min (ref >60)
Glucose, Bld: 112 mg/dL — ABNORMAL HIGH (ref 70–99)
Potassium: 3.1 mmol/L — ABNORMAL LOW (ref 3.5–5.1)
Sodium: 137 mmol/L (ref 135–145)
Total Bilirubin: 0.6 mg/dL (ref 0.0–1.2)
Total Protein: 7.2 g/dL (ref 6.5–8.1)

## 2023-11-18 MED ORDER — POTASSIUM CHLORIDE CRYS ER 20 MEQ PO TBCR: EXTENDED_RELEASE_TABLET | ORAL | 0 refills | Status: AC

## 2023-11-18 NOTE — Progress Notes (Signed)
 [facility] Perioperative Services: Pre-Admission/Anesthesia Testing  Abnormal Lab Notification and Treatment Plan of Care   Date: 11/18/23  Name: Andrea Frazier MRN:   [id number]  Re: Abnormal labs noted during PAT appointment   Notified:  Provider Name Provider Role Notification Mode  Sakai, Isami, DO General Surgery (Surgeon) Routed and/or faxed via Adair Hollingshead, MD Primary Care Provider Routed and/or faxed via [facility]   Clinical Information and Notes:  ABNORMAL LAB VALUE(S): Lab Results  Component Value Date   K 3.1 (L) 11/18/2023   Andrea Frazier is scheduled for a BREAST LUMPECTOMY WITH RADIO FREQUENCY LOCALIZER on 11/19/2023. In review of her medication reconciliation, it is noted that the patient is taking prescribed diuretic medications (HCTZ 25 mg) daily.   Please note, in efforts to promote a safe and effective anesthetic course, per current guidelines/standards set by the [facility] anesthesia team, the minimal acceptable K+ level for the patient to proceed with general anesthesia is 3.0 mmol/L. With that being said, if the patient drops any lower, her elective procedure will need to be postponed until K+ is better optimized. In efforts to prevent case cancellation, and ultimately to promote the safety of this patient undergoing sedation/anesthesia, will make efforts to optimize pre-surgical K+ level allowing the surgical intervention to proceed as planned.    Impression and Plan:  Andrea Frazier found to be HYPOkalemic at 3.1 mmol/L on preoperative labs.   She is on thiazide diuretic therapy. Discussed diuretic therapy as likely etiology in the absence of GI related symptoms (no diarrhea). Patient denies regular use of laxative medications. Reviewed other potential causes, including decreased intake of dietary K+ and other insensible losses.  Surgery is tomorrow.  We have a limited time to optimize patient prior to surgery.  Reviewed plans for  preoperative optimization as follows:   Meds ordered this encounter  Medications   potassium chloride SA (KLOR-CON M) 20 MEQ tablet    Sig: Take 3 tablets (60 mEq) today, then take 1 tablet (20 mEq) prior to surgery tomorrow.  Follow-up with PCP for repeat labs.    Dispense:  4 tablet    Refill:  0    Please contact the patient as soon as it is available for pickup. Rx is for preoperative K+ optimization and needs to be started ASAP.   Encouraged patient to follow up with PCP about 2-3 weeks postoperatively to have labs rechecked to ensure that levels are remaining within normal range. Discussed nutritional intake of K+ rich foods as an adjunctive way to keep her K+ levels normal; list of K+ rich foods provided. Also mentioned ORS, however advised her not to rely solely on these drinks, as they are high in Na+, and she has a HTN diagnosis.   Will send copy of this note to surgeon and PCP to make them aware of K+ level and plans for correction. Discussed that PCP may elect to pursue a change in diuretic therapy to a K+ sparing type medication, or alternatively, they may consider adding a daily K+ supplement if levels remain low on recheck. Order entered to recheck K+ on the day of her surgery to ensure optimization. Wished patient the best of luck with her upcoming surgery and subsequent recovery. She was encouraged to return call to the PAT clinic, or to her surgeon's office, should any questions or concerns arise between now and the time of her surgery. Patient was appreciative of the care/concern expressed by PAT staff.  Encounter Diagnoses  Name Primary?   Pre-operative laboratory examination Yes   Diuretic-induced hypokalemia    Long term current use of diuretic    Jonnie Kubly, MSN, APRN, FNP-C, CEN [facility]  Perioperative Services Nurse Practitioner Phone: [phone] 11/18/23 11:40 AM  NOTE: This note has been prepared using Dragon dictation software. Despite  my best ability to proofread, there is always the potential that unintentional transcriptional errors may still occur from this process.

## 2023-11-19 ENCOUNTER — Ambulatory Visit: Admitting: Anesthesiology

## 2023-11-19 ENCOUNTER — Other Ambulatory Visit: Payer: Self-pay

## 2023-11-19 ENCOUNTER — Ambulatory Visit: Payer: Self-pay | Admitting: Urgent Care

## 2023-11-19 ENCOUNTER — Encounter
Admission: RE | Disposition: A | Discharge status: Home / Self Care | Payer: Self-pay | Source: Home / Self Care | Attending: Surgery

## 2023-11-19 ENCOUNTER — Ambulatory Visit
Admission: RE | Admit: 2023-11-19 | Discharge: 2023-11-19 | Disposition: A | Discharge status: Home / Self Care | Source: Ambulatory Visit | Attending: Surgery | Admitting: Surgery

## 2023-11-19 ENCOUNTER — Ambulatory Visit
Admission: RE | Admit: 2023-11-19 | Discharge: 2023-11-19 | Disposition: A | Discharge status: Home / Self Care | Attending: Surgery | Admitting: Surgery

## 2023-11-19 ENCOUNTER — Encounter: Payer: Self-pay | Admitting: Surgery

## 2023-11-19 DIAGNOSIS — E785 Hyperlipidemia, unspecified: Secondary | ICD-10-CM | POA: Diagnosis not present

## 2023-11-19 DIAGNOSIS — N6081 Other benign mammary dysplasias of right breast: Secondary | ICD-10-CM | POA: Diagnosis not present

## 2023-11-19 DIAGNOSIS — Z01812 Encounter for preprocedural laboratory examination: Secondary | ICD-10-CM

## 2023-11-19 DIAGNOSIS — F17210 Nicotine dependence, cigarettes, uncomplicated: Secondary | ICD-10-CM | POA: Insufficient documentation

## 2023-11-19 DIAGNOSIS — K219 Gastro-esophageal reflux disease without esophagitis: Secondary | ICD-10-CM | POA: Insufficient documentation

## 2023-11-19 DIAGNOSIS — I10 Essential (primary) hypertension: Secondary | ICD-10-CM | POA: Diagnosis not present

## 2023-11-19 DIAGNOSIS — G3183 Dementia with Lewy bodies: Secondary | ICD-10-CM | POA: Insufficient documentation

## 2023-11-19 DIAGNOSIS — F0280 Dementia in other diseases classified elsewhere without behavioral disturbance: Secondary | ICD-10-CM | POA: Diagnosis not present

## 2023-11-19 DIAGNOSIS — J449 Chronic obstructive pulmonary disease, unspecified: Secondary | ICD-10-CM | POA: Diagnosis not present

## 2023-11-19 DIAGNOSIS — Z79899 Other long term (current) drug therapy: Secondary | ICD-10-CM

## 2023-11-19 DIAGNOSIS — N6021 Fibroadenosis of right breast: Secondary | ICD-10-CM | POA: Diagnosis not present

## 2023-11-19 DIAGNOSIS — D369 Benign neoplasm, unspecified site: Secondary | ICD-10-CM

## 2023-11-19 DIAGNOSIS — D241 Benign neoplasm of right breast: Secondary | ICD-10-CM

## 2023-11-19 DIAGNOSIS — M797 Fibromyalgia: Secondary | ICD-10-CM | POA: Diagnosis not present

## 2023-11-19 DIAGNOSIS — T502X5A Adverse effect of carbonic-anhydrase inhibitors, benzothiadiazides and other diuretics, initial encounter: Secondary | ICD-10-CM

## 2023-11-19 LAB — POCT I-STAT, CHEM 8
BUN: 13 mg/dL (ref 6–20)
Calcium, Ion: 1.16 mmol/L (ref 1.15–1.40)
Chloride: 96 mmol/L — ABNORMAL LOW (ref 98–111)
Creatinine, Ser: 0.5 mg/dL (ref 0.44–1.00)
Glucose, Bld: 108 mg/dL — ABNORMAL HIGH (ref 70–99)
HCT: 49 % — ABNORMAL HIGH (ref 36.0–46.0)
Hemoglobin: 16.7 g/dL — ABNORMAL HIGH (ref 12.0–15.0)
Potassium: 4.2 mmol/L (ref 3.5–5.1)
Sodium: 135 mmol/L (ref 135–145)
TCO2: 30 mmol/L (ref 22–32)

## 2023-11-19 SURGERY — BREAST LUMPECTOMY WITH RADIO FREQUENCY LOCALIZER
Anesthesia: General | Site: Breast | Laterality: Right

## 2023-11-19 MED ORDER — CHLORHEXIDINE GLUCONATE 0.12 % MT SOLN
OROMUCOSAL | Status: AC
  Filled 2023-11-19: qty 15

## 2023-11-19 MED ORDER — CEFAZOLIN SODIUM-DEXTROSE 2-4 GM/100ML-% IV SOLN
INTRAVENOUS | Status: AC
  Filled 2023-11-19: qty 100

## 2023-11-19 MED ORDER — OXYCODONE-ACETAMINOPHEN 5-325 MG PO TABS: 1.0000 | ORAL_TABLET | Freq: Three times a day (TID) | ORAL | 0 refills | Status: AC | PRN

## 2023-11-19 MED ORDER — FENTANYL CITRATE (PF) 100 MCG/2ML IJ SOLN: 25.0000 ug | INTRAMUSCULAR | Status: DC | PRN

## 2023-11-19 MED ORDER — LIDOCAINE HCL (PF) 1 % IJ SOLN
INTRAMUSCULAR | Status: AC
  Filled 2023-11-19: qty 30

## 2023-11-19 MED ORDER — ACETAMINOPHEN 500 MG PO TABS
ORAL_TABLET | ORAL | Status: AC
  Filled 2023-11-19: qty 2

## 2023-11-19 MED ORDER — CHLORHEXIDINE GLUCONATE 0.12 % MT SOLN
15.0000 mL | Freq: Once | OROMUCOSAL | Status: AC
Start: 2023-11-19 — End: 2023-11-19
  Administered 2023-11-19: 15 mL via OROMUCOSAL

## 2023-11-19 MED ORDER — ACETAMINOPHEN 500 MG PO TABS
1000.0000 mg | ORAL_TABLET | ORAL | Status: AC
  Administered 2023-11-19: 1000 mg via ORAL

## 2023-11-19 MED ORDER — CHLORHEXIDINE GLUCONATE CLOTH 2 % EX PADS
6.0000 | MEDICATED_PAD | Freq: Once | CUTANEOUS | Status: AC
  Administered 2023-11-19: 6 via TOPICAL

## 2023-11-19 MED ORDER — GABAPENTIN 300 MG PO CAPS
ORAL_CAPSULE | ORAL | Status: AC
  Filled 2023-11-19: qty 1

## 2023-11-19 MED ORDER — LIDOCAINE HCL (PF) 1 % IJ SOLN
INTRAMUSCULAR | Status: DC | PRN
  Administered 2023-11-19: 36 mL via INTRAMUSCULAR

## 2023-11-19 MED ORDER — PROPOFOL 10 MG/ML IV BOLUS
INTRAVENOUS | Status: DC | PRN
Start: 2023-11-19 — End: 2023-11-19
  Administered 2023-11-19: 120 mg via INTRAVENOUS

## 2023-11-19 MED ORDER — FENTANYL CITRATE (PF) 100 MCG/2ML IJ SOLN
INTRAMUSCULAR | Status: DC | PRN
  Administered 2023-11-19: 25 ug via INTRAVENOUS

## 2023-11-19 MED ORDER — MIDAZOLAM HCL 2 MG/2ML IJ SOLN
INTRAMUSCULAR | Status: AC
  Filled 2023-11-19: qty 2

## 2023-11-19 MED ORDER — DEXAMETHASONE SODIUM PHOSPHATE 10 MG/ML IJ SOLN
INTRAMUSCULAR | Status: DC | PRN
Start: 2023-11-19 — End: 2023-11-19
  Administered 2023-11-19: 10 mg via INTRAVENOUS

## 2023-11-19 MED ORDER — LACTATED RINGERS IV SOLN: INTRAVENOUS | Status: DC

## 2023-11-19 MED ORDER — ONDANSETRON HCL 4 MG/2ML IJ SOLN
INTRAMUSCULAR | Status: AC
  Filled 2023-11-19: qty 2

## 2023-11-19 MED ORDER — LIDOCAINE HCL (PF) 2 % IJ SOLN
INTRAMUSCULAR | Status: AC
  Filled 2023-11-19: qty 5

## 2023-11-19 MED ORDER — MIDAZOLAM HCL 2 MG/2ML IJ SOLN
INTRAMUSCULAR | Status: DC | PRN
Start: 2023-11-19 — End: 2023-11-19
  Administered 2023-11-19: 2 mg via INTRAVENOUS

## 2023-11-19 MED ORDER — BUPIVACAINE-EPINEPHRINE (PF) 0.5% -1:200000 IJ SOLN
INTRAMUSCULAR | Status: AC
  Filled 2023-11-19: qty 30

## 2023-11-19 MED ORDER — CELECOXIB 200 MG PO CAPS
200.0000 mg | ORAL_CAPSULE | ORAL | Status: AC
  Administered 2023-11-19: 200 mg via ORAL

## 2023-11-19 MED ORDER — ACETAMINOPHEN 325 MG PO TABS: 650.0000 mg | ORAL_TABLET | Freq: Three times a day (TID) | ORAL | 0 refills | Status: AC | PRN

## 2023-11-19 MED ORDER — GABAPENTIN 300 MG PO CAPS
300.0000 mg | ORAL_CAPSULE | Freq: Once | ORAL | Status: AC
  Administered 2023-11-19: 300 mg via ORAL

## 2023-11-19 MED ORDER — DEXAMETHASONE SODIUM PHOSPHATE 10 MG/ML IJ SOLN
INTRAMUSCULAR | Status: AC
  Filled 2023-11-19: qty 1

## 2023-11-19 MED ORDER — PROPOFOL 10 MG/ML IV BOLUS
INTRAVENOUS | Status: AC
  Filled 2023-11-19: qty 20

## 2023-11-19 MED ORDER — ORAL CARE MOUTH RINSE
15.0000 mL | Freq: Once | OROMUCOSAL | Status: AC
Start: 2023-11-19 — End: 2023-11-19

## 2023-11-19 MED ORDER — EPHEDRINE SULFATE-NACL 50-0.9 MG/10ML-% IV SOSY
PREFILLED_SYRINGE | INTRAVENOUS | Status: DC | PRN
Start: 2023-11-19 — End: 2023-11-19
  Administered 2023-11-19 (×3): 5 mg via INTRAVENOUS

## 2023-11-19 MED ORDER — FENTANYL CITRATE (PF) 100 MCG/2ML IJ SOLN
INTRAMUSCULAR | Status: AC
  Filled 2023-11-19: qty 2

## 2023-11-19 MED ORDER — LIDOCAINE HCL (CARDIAC) PF 100 MG/5ML IV SOSY
PREFILLED_SYRINGE | INTRAVENOUS | Status: DC | PRN
Start: 2023-11-19 — End: 2023-11-19
  Administered 2023-11-19: 60 mg via INTRAVENOUS

## 2023-11-19 MED ORDER — EPHEDRINE 5 MG/ML INJ
INTRAVENOUS | Status: AC
  Filled 2023-11-19: qty 5

## 2023-11-19 MED ORDER — DROPERIDOL 2.5 MG/ML IJ SOLN
0.6250 mg | Freq: Once | INTRAMUSCULAR | Status: DC | PRN
Start: 2023-11-19 — End: 2023-11-19

## 2023-11-19 MED ORDER — PHENYLEPHRINE 80 MCG/ML (10ML) SYRINGE FOR IV PUSH (FOR BLOOD PRESSURE SUPPORT)
PREFILLED_SYRINGE | INTRAVENOUS | Status: DC | PRN
Start: 2023-11-19 — End: 2023-11-19
  Administered 2023-11-19 (×6): 80 ug via INTRAVENOUS

## 2023-11-19 MED ORDER — DOCUSATE SODIUM 100 MG PO CAPS: 100.0000 mg | ORAL_CAPSULE | Freq: Two times a day (BID) | ORAL | 0 refills | Status: AC | PRN

## 2023-11-19 MED ORDER — ONDANSETRON HCL 4 MG/2ML IJ SOLN
INTRAMUSCULAR | Status: DC | PRN
  Administered 2023-11-19: 4 mg via INTRAVENOUS

## 2023-11-19 MED ORDER — PHENYLEPHRINE 80 MCG/ML (10ML) SYRINGE FOR IV PUSH (FOR BLOOD PRESSURE SUPPORT)
PREFILLED_SYRINGE | INTRAVENOUS | Status: AC
  Filled 2023-11-19: qty 10

## 2023-11-19 MED ORDER — STERILE WATER FOR IRRIGATION IR SOLN
Status: DC | PRN
  Administered 2023-11-19: 1

## 2023-11-19 MED ORDER — CEFAZOLIN SODIUM-DEXTROSE 2-4 GM/100ML-% IV SOLN
2.0000 g | INTRAVENOUS | Status: AC
  Administered 2023-11-19: 2 g via INTRAVENOUS

## 2023-11-19 MED ORDER — CELECOXIB 200 MG PO CAPS
ORAL_CAPSULE | ORAL | Status: AC
  Filled 2023-11-19: qty 1

## 2023-11-19 SURGICAL SUPPLY — 31 items
BLADE PHOTON ILLUMINATED (MISCELLANEOUS) ×1 IMPLANT
BLADE SURG 15 STRL LF DISP TIS (BLADE) ×1 IMPLANT
CHLORAPREP W/TINT 26 (MISCELLANEOUS) IMPLANT
DERMABOND ADVANCED .7 DNX12 (GAUZE/BANDAGES/DRESSINGS) ×1 IMPLANT
DEVICE DUBIN SPECIMEN MAMMOGRA (MISCELLANEOUS) ×1 IMPLANT
DRAPE LAPAROTOMY TRNSV 106X77 (MISCELLANEOUS) ×1 IMPLANT
ELECTRODE REM PT RTRN 9FT ADLT (ELECTROSURGICAL) ×1 IMPLANT
GAUZE 4X4 16PLY ~~LOC~~+RFID DBL (SPONGE) IMPLANT
GLOVE BIOGEL PI IND STRL 7.0 (GLOVE) ×1 IMPLANT
GLOVE SURG SYN 6.5 ES PF (GLOVE) ×3 IMPLANT
GLOVE SURG SYN 6.5 PF PI (GLOVE) ×3 IMPLANT
GOWN STRL REUS W/ TWL LRG LVL3 (GOWN DISPOSABLE) ×3 IMPLANT
KIT MARKER MARGIN INK (KITS) ×1 IMPLANT
KIT TURNOVER KIT A (KITS) ×1 IMPLANT
LABEL OR SOLS (LABEL) ×1 IMPLANT
LIGHT WAVEGUIDE WIDE FLAT (MISCELLANEOUS) IMPLANT
MANIFOLD NEPTUNE II (INSTRUMENTS) ×1 IMPLANT
MARKER MARGIN CORRECT CLIP (MARKER) ×1 IMPLANT
NDL HYPO 22X1.5 SAFETY MO (MISCELLANEOUS) ×2 IMPLANT
NEEDLE HYPO 22X1.5 SAFETY MO (MISCELLANEOUS) ×2 IMPLANT
PACK BASIN MINOR ARMC (MISCELLANEOUS) ×1 IMPLANT
SHEATH BREAST BIOPSY SKIN MKR (SHEATH) ×1 IMPLANT
SUT SILK 2-0 30XBRD TIE 12 (SUTURE) IMPLANT
SUT SILK 3 0 12 30 (SUTURE) IMPLANT
SUT VIC AB 3-0 SH 27X BRD (SUTURE) ×1 IMPLANT
SUTURE MNCRL 4-0 27XMF (SUTURE) ×1 IMPLANT
SYR 20ML LL LF (SYRINGE) ×1 IMPLANT
TRAP FLUID SMOKE EVACUATOR (MISCELLANEOUS) ×1 IMPLANT
TRAP NEPTUNE SPECIMEN COLLECT (MISCELLANEOUS) ×1 IMPLANT
WATER STERILE IRR 1000ML POUR (IV SOLUTION) ×1 IMPLANT
WATER STERILE IRR 500ML POUR (IV SOLUTION) ×1 IMPLANT

## 2023-11-19 NOTE — Discharge Instructions (Signed)
 Removal, Care After This sheet gives you information about how to care for yourself after your procedure. Your health care provider may also give you more specific instructions. If you have problems or questions, contact your health care provider. What can I expect after the procedure? After the procedure, it is common to have: Soreness. Bruising. Itching. Follow these instructions at home: site care Follow instructions from your health care provider about how to take care of your site. Make sure you: Wash your hands with soap and water before and after you change your bandage (dressing). If soap and water are not available, use hand sanitizer. Leave stitches (sutures), skin glue, or adhesive strips in place. These skin closures may need to stay in place for 2 weeks or longer. If adhesive strip edges start to loosen and curl up, you may trim the loose edges. Do not remove adhesive strips completely unless your health care provider tells you to do that. If the area bleeds or bruises, apply gentle pressure for 10 minutes. OK TO SHOWER IN 24HRS  Check your site every day for signs of infection. Check for: Redness, swelling, or pain. Fluid or blood. Warmth. Pus or a bad smell.  General instructions Rest and then return to your normal activities as told by your health care provider.  tylenol and advil as needed for discomfort.  Please alternate between the two every four hours as needed for pain.    Use narcotics, if prescribed, only when tylenol and motrin is not enough to control pain.  325-650mg  every 8hrs to max of 3000mg /24hrs (including the 325mg  in every norco dose) for the tylenol.    Advil up to 800mg  per dose every 8hrs as needed for pain.   Keep all follow-up visits as told by your health care provider. This is important. Contact a health care provider if: You have redness, swelling, or pain around your site. You have fluid or blood coming from your site. Your site feels warm to  the touch. You have pus or a bad smell coming from your site. You have a fever. Your sutures, skin glue, or adhesive strips loosen or come off sooner than expected. Get help right away if: You have bleeding that does not stop with pressure or a dressing. Summary After the procedure, it is common to have some soreness, bruising, and itching at the site. Follow instructions from your health care provider about how to take care of your site. Check your site every day for signs of infection. Contact a health care provider if you have redness, swelling, or pain around your site, or your site feels warm to the touch. Keep all follow-up visits as told by your health care provider. This is important. This information is not intended to replace advice given to you by your health care provider. Make sure you discuss any questions you have with your health care provider. Document Released: 06/28/2015 Document Revised: 11/29/2017 Document Reviewed: 11/29/2017 Elsevier Interactive Patient Education  [employer].

## 2023-11-19 NOTE — Anesthesia Procedure Notes (Signed)
 Procedure Name: LMA Insertion Date/Time: 11/19/2023 7:37 AM  Performed by: Orin Birk, CRNAPre-anesthesia Checklist: Patient identified, Emergency Drugs available, Suction available and Patient being monitored Patient Re-evaluated:Patient Re-evaluated prior to induction Oxygen Delivery Method: Circle system utilized Preoxygenation: Pre-oxygenation with 100% oxygen Induction Type: IV induction LMA: LMA inserted LMA Size: 4.0 Number of attempts: 1 Placement Confirmation: positive ETCO2 and breath sounds checked- equal and bilateral Tube secured with: Tape Dental Injury: Teeth and Oropharynx as per pre-operative assessment

## 2023-11-19 NOTE — Op Note (Signed)
 Preoperative diagnosis: Right breast papilloma.  Postoperative diagnosis: Same.   Procedure: SCOUT tag-localized right breast lumpectomy   Anesthesia: LMA  Surgeon: Dr. Conrado Delay  Wound Classification: Clean  Indications: Patient is a 53 y.o. female with a nonpalpable right breast mass noted on mammography with core biopsy demonstrating papilloma requires SCOUT localizer placement, lumpectomy to confirm no concurrent malignancy  Specimen: Right breast mass, and medial margin  Complications: None  Estimated Blood Loss: 15 mL  Findings: 1. Specimen mammography shows marker and SCOUT localizer on specimen 2. Pathology call refers gross examination of margins was positive on medial aspect for additional scar tissue    Description of procedure: SCOUT localization was performed by radiology prior to procedure. In the nuclear medicine suite, the subareolar region was injected with Tc-99 sulfur colloid the morning of procedure. Localization studies were reviewed. The patient was taken to the operating room and placed supine on the operating table, and after general anesthesia the right breast and axilla were prepped and draped in the usual sterile fashion. A time-out was completed verifying correct patient, procedure, site, positioning, and implant(s) and/or special equipment prior to beginning this procedure.  By identifying the SCOUT localizer, the probable trajectory and location of the mass was visualized. A skin incision was planned in such a way as to minimize the amount of dissection to reach the mass.  The skin incision was made after infusion of local. Flaps were raised and  Sharp and blunt dissection was then taken down to the mass, taking care to include the entire SCOUT localizer and a margin of grossly normal tissue. The specimen was removed. The specimen was oriented with paint. Imaging reviewed and the entire target lesion had been resected, with biopsy clip and localizer within  the specimen.Gross margin analysis by pathology some involvement of medial margin.  Additional medial margin resected, painted passed off field pending pathology.    wound irrigated, hemostasis was achieved and the wound closed in layers with  interrupted sutures of 3-0 Vicryl in deep dermal layer and a running subcuticular suture of Monocryl 4-0, then dressed with dermabond. The patient tolerated the procedure well and was taken to the postanesthesia care unit in stable condition. Sponge and instrument count correct at end of procedure.

## 2023-11-19 NOTE — Anesthesia Postprocedure Evaluation (Signed)
 Anesthesia Post Note  Patient: Andrea Frazier  Procedure(s) Performed: BREAST LUMPECTOMY WITH RADIO FREQUENCY LOCALIZER (Right: Breast)  Patient location during evaluation: PACU Anesthesia Type: General Level of consciousness: awake and alert Pain management: pain level controlled Vital Signs Assessment: post-procedure vital signs reviewed and stable Respiratory status: spontaneous breathing, nonlabored ventilation, respiratory function stable and patient connected to nasal cannula oxygen Cardiovascular status: blood pressure returned to baseline and stable Postop Assessment: no apparent nausea or vomiting Anesthetic complications: no   No notable events documented.   Last Vitals:  Vitals:   11/19/23 0915 11/19/23 0919  BP: 112/71 (!) 122/98  Pulse: 85 85  Resp: 16 16  Temp: 36.4 C   SpO2: 94% 98%    Last Pain:  Vitals:   11/19/23 0919  TempSrc:   PainSc: 0-No pain                 Vanice Genre

## 2023-11-19 NOTE — Anesthesia Preprocedure Evaluation (Signed)
 Anesthesia Evaluation  Patient identified by MRN, date of birth, ID band Patient awake    Reviewed: Allergy & Precautions, H&P , NPO status , Patient's Chart, lab work & pertinent test results, reviewed documented beta blocker date and time   History of Anesthesia Complications Negative for: history of anesthetic complications  Airway Mallampati: II  TM Distance: >3 FB Neck ROM: full    Dental  (+) Dental Advidsory Given, Missing, Poor Dentition   Pulmonary neg shortness of breath, neg sleep apnea, COPD, neg recent URI, Current Smoker and Patient abstained from smoking.   Pulmonary exam normal breath sounds clear to auscultation       Cardiovascular Exercise Tolerance: Good hypertension, (-) angina (-) Past MI and (-) Cardiac Stents Normal cardiovascular exam(-) dysrhythmias + Valvular Problems/Murmurs (history of)  Rhythm:regular Rate:Normal     Neuro/Psych  PSYCHIATRIC DISORDERS ([place] [facility])     Dementia negative neurological ROS     GI/Hepatic ,GERD  ,,NAFLD   Endo/Other  negative endocrine ROS    Renal/GU negative Renal ROS  negative genitourinary   Musculoskeletal   Abdominal   Peds  Hematology negative hematology ROS (+)   Anesthesia Other Findings Past Medical History: No date: Arthritis     Comment:  back No date: Chronic pain syndrome No date: Cigarette smoker No date: COPD (chronic obstructive pulmonary disease) (HCC) No date: Dyspnea No date: Family history of adverse reaction to anesthesia     Comment:  mom-delayed emergence No date: Fatty liver No date: Fibromyalgia No date: GERD (gastroesophageal reflux disease) No date: Heart murmur No date: Hyperlipidemia No date: Hypertension No date: Lewy body dementia (HCC)     Comment:  unable to tolerate Namenda and does not take any               medications No date: Papilloma of right breast No date: Vitamin D deficiency    Reproductive/Obstetrics negative OB ROS                             Anesthesia Physical Anesthesia Plan  ASA: 3  Anesthesia Plan: General   Post-op Pain Management:    Induction: Intravenous  PONV Risk Score and Plan: 2 and Ondansetron, Dexamethasone, Midazolam  and Treatment may vary due to age or medical condition  Airway Management Planned: LMA  Additional Equipment:   Intra-op Plan:   Post-operative Plan: Extubation in OR  Informed Consent: I have reviewed the patients History and Physical, chart, labs and discussed the procedure including the risks, benefits and alternatives for the proposed anesthesia with the patient or authorized representative who has indicated his/her understanding and acceptance.     Dental Advisory Given  Plan Discussed with: Anesthesiologist, CRNA and Surgeon  Anesthesia Plan Comments:         Anesthesia Quick Evaluation

## 2023-11-19 NOTE — Transfer of Care (Signed)
 Immediate Anesthesia Transfer of Care Note  Patient: Andrea Frazier  Procedure(s) Performed: BREAST LUMPECTOMY WITH RADIO FREQUENCY LOCALIZER (Right: Breast)  Patient Location: PACU  Anesthesia Type:General  Level of Consciousness: drowsy and patient cooperative  Airway & Oxygen Therapy: Patient Spontanous Breathing and Patient connected to face mask oxygen  Post-op Assessment: Report given to RN and Post -op Vital signs reviewed and stable  Post vital signs: Reviewed and stable  Last Vitals:  Vitals Value Taken Time  BP 115/50 11/19/23 0843  Temp 36.6 C 11/19/23 0843  Pulse 70 11/19/23 0844  Resp 15 11/19/23 0844  SpO2 99 % 11/19/23 0844  Vitals shown include unfiled device data.  Last Pain:  Vitals:   11/19/23 0843  TempSrc:   PainSc: Asleep         Complications: No notable events documented.

## 2023-11-19 NOTE — Interval H&P Note (Signed)
 No change. OK to proceed.

## 2023-11-22 ENCOUNTER — Encounter: Payer: Self-pay | Admitting: Surgery

## 2023-11-22 LAB — SURGICAL PATHOLOGY

## 2023-12-07 ENCOUNTER — Encounter: Payer: Self-pay | Admitting: Surgery

## 2024-01-17 ENCOUNTER — Encounter: Payer: Self-pay | Admitting: Gastroenterology

## 2024-01-17 ENCOUNTER — Ambulatory Visit
Admission: RE | Admit: 2024-01-17 | Discharge: 2024-01-17 | Disposition: A | Discharge status: Home / Self Care | Attending: Gastroenterology | Admitting: Gastroenterology

## 2024-01-17 ENCOUNTER — Ambulatory Visit: Admitting: Certified Registered"

## 2024-01-17 ENCOUNTER — Encounter
Admission: RE | Disposition: A | Discharge status: Home / Self Care | Payer: Self-pay | Source: Home / Self Care | Attending: Gastroenterology

## 2024-01-17 DIAGNOSIS — K2289 Other specified disease of esophagus: Secondary | ICD-10-CM | POA: Diagnosis not present

## 2024-01-17 DIAGNOSIS — Z6830 Body mass index (BMI) 30.0-30.9, adult: Secondary | ICD-10-CM | POA: Insufficient documentation

## 2024-01-17 DIAGNOSIS — G3183 Dementia with Lewy bodies: Secondary | ICD-10-CM | POA: Diagnosis not present

## 2024-01-17 DIAGNOSIS — M797 Fibromyalgia: Secondary | ICD-10-CM | POA: Diagnosis not present

## 2024-01-17 DIAGNOSIS — K2960 Other gastritis without bleeding: Secondary | ICD-10-CM | POA: Diagnosis not present

## 2024-01-17 DIAGNOSIS — E66813 Obesity, class 3: Secondary | ICD-10-CM | POA: Diagnosis not present

## 2024-01-17 DIAGNOSIS — J449 Chronic obstructive pulmonary disease, unspecified: Secondary | ICD-10-CM | POA: Diagnosis not present

## 2024-01-17 DIAGNOSIS — R1310 Dysphagia, unspecified: Secondary | ICD-10-CM | POA: Insufficient documentation

## 2024-01-17 DIAGNOSIS — R1084 Generalized abdominal pain: Secondary | ICD-10-CM | POA: Insufficient documentation

## 2024-01-17 DIAGNOSIS — K259 Gastric ulcer, unspecified as acute or chronic, without hemorrhage or perforation: Secondary | ICD-10-CM | POA: Insufficient documentation

## 2024-01-17 DIAGNOSIS — K3189 Other diseases of stomach and duodenum: Secondary | ICD-10-CM | POA: Diagnosis not present

## 2024-01-17 DIAGNOSIS — R110 Nausea: Secondary | ICD-10-CM | POA: Insufficient documentation

## 2024-01-17 DIAGNOSIS — Z8261 Family history of arthritis: Secondary | ICD-10-CM | POA: Insufficient documentation

## 2024-01-17 DIAGNOSIS — Z5986 Financial insecurity: Secondary | ICD-10-CM | POA: Insufficient documentation

## 2024-01-17 DIAGNOSIS — Z5941 Food insecurity: Secondary | ICD-10-CM | POA: Insufficient documentation

## 2024-01-17 DIAGNOSIS — F17210 Nicotine dependence, cigarettes, uncomplicated: Secondary | ICD-10-CM | POA: Diagnosis not present

## 2024-01-17 DIAGNOSIS — I10 Essential (primary) hypertension: Secondary | ICD-10-CM | POA: Diagnosis not present

## 2024-01-17 DIAGNOSIS — R011 Cardiac murmur, unspecified: Secondary | ICD-10-CM | POA: Insufficient documentation

## 2024-01-17 DIAGNOSIS — K219 Gastro-esophageal reflux disease without esophagitis: Secondary | ICD-10-CM | POA: Diagnosis not present

## 2024-01-17 DIAGNOSIS — M1990 Unspecified osteoarthritis, unspecified site: Secondary | ICD-10-CM | POA: Diagnosis not present

## 2024-01-17 DIAGNOSIS — Z800 Family history of malignant neoplasm of digestive organs: Secondary | ICD-10-CM | POA: Insufficient documentation

## 2024-01-17 DIAGNOSIS — F0280 Dementia in other diseases classified elsewhere without behavioral disturbance: Secondary | ICD-10-CM | POA: Insufficient documentation

## 2024-01-17 DIAGNOSIS — Z8249 Family history of ischemic heart disease and other diseases of the circulatory system: Secondary | ICD-10-CM | POA: Diagnosis not present

## 2024-01-17 DIAGNOSIS — Z79899 Other long term (current) drug therapy: Secondary | ICD-10-CM | POA: Diagnosis not present

## 2024-01-17 SURGERY — COLONOSCOPY
Anesthesia: General

## 2024-01-17 MED ORDER — OMEPRAZOLE 40 MG PO CPDR: 40.0000 mg | DELAYED_RELEASE_CAPSULE | Freq: Every day | ORAL | 1 refills | Status: AC

## 2024-01-17 MED ORDER — SODIUM CHLORIDE 0.9 % IV SOLN
INTRAVENOUS | Status: DC
  Administered 2024-01-17: 500 mL via INTRAVENOUS

## 2024-01-17 MED ORDER — LIDOCAINE HCL (CARDIAC) PF 100 MG/5ML IV SOSY
PREFILLED_SYRINGE | INTRAVENOUS | Status: DC | PRN
  Administered 2024-01-17: 100 mg via INTRAVENOUS

## 2024-01-17 MED ORDER — PROPOFOL 10 MG/ML IV BOLUS
INTRAVENOUS | Status: DC | PRN
  Administered 2024-01-17 (×2): 40 mg via INTRAVENOUS
  Administered 2024-01-17: 50 mg via INTRAVENOUS
  Administered 2024-01-17: 120 mg via INTRAVENOUS
  Administered 2024-01-17: 50 mg via INTRAVENOUS

## 2024-01-17 NOTE — Anesthesia Preprocedure Evaluation (Signed)
 Anesthesia Evaluation  Patient identified by MRN, date of birth, ID band Patient awake    Airway Mallampati: III  TM Distance: >3 FB     Dental  (+) Edentulous Upper   Pulmonary shortness of breath, COPD,  COPD inhaler, Current Smoker    + decreased breath sounds      Cardiovascular Exercise Tolerance: Poor hypertension, Pt. on medications + Valvular Problems/Murmurs  Rhythm:Regular Rate:Normal     Neuro/Psych       Dementia    GI/Hepatic Neg liver ROS,GERD  Medicated,,  Endo/Other    Class 3 obesity  Renal/GU negative Renal ROS  negative genitourinary   Musculoskeletal  (+) Arthritis ,  Fibromyalgia -  Abdominal  (+) + obese  Peds  Hematology   Anesthesia Other Findings   Reproductive/Obstetrics                              Anesthesia Physical Anesthesia Plan  ASA: 3  Anesthesia Plan: General   Post-op Pain Management:    Induction:   PONV Risk Score and Plan:   Airway Management Planned: Natural Airway and Nasal Cannula  Additional Equipment:   Intra-op Plan:   Post-operative Plan:   Informed Consent: I have reviewed the patients History and Physical, chart, labs and discussed the procedure including the risks, benefits and alternatives for the proposed anesthesia with the patient or authorized representative who has indicated his/her understanding and acceptance.       Plan Discussed with: CRNA  Anesthesia Plan Comments:         Anesthesia Quick Evaluation

## 2024-01-17 NOTE — Anesthesia Postprocedure Evaluation (Signed)
 Anesthesia Post Note  Patient: Andrea Frazier  Procedure(s) Performed: COLONOSCOPY EGD (ESOPHAGOGASTRODUODENOSCOPY)  Patient location during evaluation: PACU Anesthesia Type: General Level of consciousness: awake and awake and alert Pain management: satisfactory to patient Vital Signs Assessment: post-procedure vital signs reviewed and stable Respiratory status: spontaneous breathing Cardiovascular status: stable Anesthetic complications: no   No notable events documented.   Last Vitals:  Vitals:   01/17/24 1208 01/17/24 1217  BP: 113/73 117/80  Pulse: 67 71  Resp: 12 14  Temp:    SpO2: 100% 99%    Last Pain:  Vitals:   01/17/24 1217  TempSrc:   PainSc: 0-No pain                 VAN STAVEREN,Laira Penninger

## 2024-01-17 NOTE — Op Note (Signed)
 [facility]enter[facility] Patient Name: Andrea Frazier Procedure Date: 01/17/2024 11:01 AM MRN: [id number] Account #: [id number] Date of Birth: November 30, 1970 Admit Type: Outpatient Age: 53 Room: [facility] ENDO ROOM 1 Gender: Female Note Status: Supervisor Override Instrument Name: Arvis [id number] Procedure:             Colonoscopy Indications:           Colon cancer screening in patient at increased risk:                         Family history of 1st-degree relative with colon                         polyps, Generalized abdominal pain Providers:             Ruel Kung MD, MD Referring MD:          Lynwood FALCON. Valora, MD (Referring MD) Medicines:             Monitored Anesthesia Care Complications:         No immediate complications. Procedure:             Pre-Anesthesia Assessment:                        - Prior to the procedure, a History and Physical was                         performed, and patient medications, allergies and                         sensitivities were reviewed. The patient's tolerance                         of previous anesthesia was reviewed.                        - The risks and benefits of the procedure and the                         sedation options and risks were discussed with the                         patient. All questions were answered and informed                         consent was obtained.                        - ASA Grade Assessment: II - A patient with mild                         systemic disease.                        After obtaining informed consent, the colonoscope was                         passed under direct vision. Throughout the procedure,  the patient's blood pressure, pulse, and oxygen                         saturations were monitored continuously. The                         Colonoscope was introduced through the anus and                         advanced to the the cecum, identified by  the                         appendiceal orifice. The colonoscopy was performed                         with ease. The patient tolerated the procedure well.                         The quality of the bowel preparation was excellent.                         The ileocecal valve, appendiceal orifice, and rectum                         were photographed. Findings:      The perianal and digital rectal examinations were normal.      The entire examined colon appeared normal. Impression:            - The entire examined colon is normal.                        - No specimens collected.                        - unable to retroflex due to short rectum Recommendation:        - Discharge patient to home (with escort).                        - Resume previous diet.                        - Continue present medications.                        - Repeat colonoscopy in 10 years for screening                         purposes.                        - Return to GI clinic as previously scheduled. Procedure Code(s):     --- Professional ---                        [phone], Colonoscopy, flexible; diagnostic, including                         collection of specimen(s) by brushing or washing, when  performed (separate procedure) Diagnosis Code(s):     --- Professional ---                        R10.84, Generalized abdominal pain CPT copyright 2022 American Medical Association. All rights reserved. The codes documented in this report are preliminary and upon coder review may  be revised to meet current compliance requirements. Ruel Kung, MD Ruel Kung MD, MD 01/17/2024 11:41:46 AM This report has been signed electronically. Number of Addenda: 0 Note Initiated On: 01/17/2024 11:01 AM Scope Withdrawal Time: 0 hours 7 minutes 8 seconds  Total Procedure Duration: 0 hours 10 minutes 27 seconds  Estimated Blood Loss:  Estimated blood loss: none.      [facility]

## 2024-01-17 NOTE — Transfer of Care (Signed)
 Immediate Anesthesia Transfer of Care Note  Patient: Andrea Frazier  Procedure(s) Performed: COLONOSCOPY EGD (ESOPHAGOGASTRODUODENOSCOPY)  Patient Location: Endoscopy Unit  Anesthesia Type:General  Level of Consciousness: awake, alert , and oriented  Airway & Oxygen Therapy: Patient Spontanous Breathing  Post-op Assessment: Report given to RN, Post -op Vital signs reviewed and stable, and Patient moving all extremities  Post vital signs: Reviewed and stable  Last Vitals:  Vitals Value Taken Time  BP 109/71 01/17/24 11:45  Temp 36.1 C 01/17/24 11:44  Pulse 79 01/17/24 11:46  Resp 15 01/17/24 11:46  SpO2 99 % 01/17/24 11:46  Vitals shown include unfiled device data.  Last Pain:  Vitals:   01/17/24 1144  TempSrc: Tympanic  PainSc: Asleep         Complications: No notable events documented.

## 2024-01-17 NOTE — Op Note (Signed)
 [facility] Gastroenterology Patient Name: Andrea Frazier Procedure Date: 01/17/2024 11:02 AM MRN: [id number] Account #: [id number] Date of Birth: [birth year] Admit Type: Outpatient Age: 53 Room: [facility] ENDO ROOM 1 Gender: Female Note Status: Supervisor Override Instrument Name: Barnie Endoscope [id number] Procedure:             Upper GI endoscopy Indications:           Dysphagia, Nausea Providers:             Ruel Kung MD, MD Referring MD:          Lynwood FALCON. Valora, MD (Referring MD) Medicines:             Monitored Anesthesia Care Complications:         No immediate complications. Procedure:             Pre-Anesthesia Assessment:                        - Prior to the procedure, a History and Physical was                         performed, and patient medications, allergies and                         sensitivities were reviewed. The patient's tolerance                         of previous anesthesia was reviewed.                        - The risks and benefits of the procedure and the                         sedation options and risks were discussed with the                         patient. All questions were answered and informed                         consent was obtained.                        - ASA Grade Assessment: II - A patient with mild                         systemic disease.                        After obtaining informed consent, the endoscope was                         passed under direct vision. Throughout the procedure,                         the patient's blood pressure, pulse, and oxygen                         saturations were monitored continuously. The Endoscope  was introduced through the mouth, and advanced to the                         third part of duodenum. The upper GI endoscopy was                         accomplished with ease. The patient tolerated the                         procedure well. Findings:      The  examined duodenum was normal.      Diffuse severe mucosal changes characterized by congestion,       discoloration and sloughing were found in the entire esophagus. Biopsies       were taken with a cold forceps for histology.      Diffuse severe mucosal changes characterized by congestion, granularity,       inflammation and ulceration were found on the greater curvature of the       gastric body. Biopsies were taken with a cold forceps for histology.      The cardia and gastric fundus were normal on retroflexion. Impression:            - Normal examined duodenum.                        - Congested, discolored mucosa in the esophagus.                         Biopsied.                        - Congested, granular, inflamed and ulcerated mucosa                         in the greater curvature of the gastric body. Biopsied. Recommendation:        - Await pathology results.                        - stop use of bc powder and commence on prilosec 40 mg                         a day for 16 weeks                        Repeat egd in 8-12 weeks to check for healing Procedure Code(s):     --- Professional ---                        [phone], Esophagogastroduodenoscopy, flexible,                         transoral; with biopsy, single or multiple Diagnosis Code(s):     --- Professional ---                        K22.89, Other specified disease of esophagus                        K31.89, Other diseases of stomach and duodenum  K29.70, Gastritis, unspecified, without bleeding                        K25.9, Gastric ulcer, unspecified as acute or chronic,                         without hemorrhage or perforation                        R13.10, Dysphagia, unspecified CPT copyright 2022 American Medical Association. All rights reserved. The codes documented in this report are preliminary and upon coder review may  be revised to meet current compliance requirements. Ruel Kung, MD Ruel Kung MD, MD 01/17/2024 11:28:51 AM This report has been signed electronically. Number of Addenda: 0 Note Initiated On: 01/17/2024 11:02 AM Estimated Blood Loss:  Estimated blood loss: none.      [facility]

## 2024-01-17 NOTE — H&P (Signed)
 Ruel Kung , MD [address], [place], [place], [address] Phone: [phone] Fax: [phone]  Primary Care Physician:  Valora Agent, MD   Pre-Procedure History & Physical: HPI:  Andrea Frazier is a 53 y.o. female is here for an endoscopy and colonoscopy    Past Medical History:  Diagnosis Date   Arthritis    back   Chronic pain syndrome    Cigarette smoker    COPD (chronic obstructive pulmonary disease) (HCC)    Dyspnea    Family history of adverse reaction to anesthesia    mom-delayed emergence   Fatty liver    Fibromyalgia    GERD (gastroesophageal reflux disease)    Heart murmur    Hyperlipidemia    Hypertension    Lewy body dementia (HCC)    unable to tolerate Namenda and does not take any medications   Papilloma of right breast    Vitamin D deficiency     Past Surgical History:  Procedure Laterality Date   BREAST BIOPSY Left 2012   neg done by dr dellie if office   BREAST BIOPSY Left 11/03/2023   US  LT BREAST BX W LOC DEV EA ADD LESION IMG BX SPEC US  GUIDE 11/03/2023 [facility]   BREAST BIOPSY Right 11/03/2023   US  RT BREAST BX W LOC DEV 1ST LESION IMG BX SPEC US  GUIDE 11/03/2023 [facility]   BREAST BIOPSY Right 11/11/2023   US  RT BREAST SAVI/RF TAG 1ST LESION US  GUIDE 11/11/2023 [facility]   BREAST LUMPECTOMY WITH RADIO FREQUENCY LOCALIZER Right 11/19/2023   Procedure: BREAST LUMPECTOMY WITH RADIO FREQUENCY LOCALIZER;  Surgeon: Tye Millet, DO;  Location: [facility];  Service: General;  Laterality: Right;   COLONOSCOPY WITH PROPOFOL  N/A 09/21/2017   Procedure: COLONOSCOPY WITH PROPOFOL ;  Surgeon: Kung Ruel, MD;  Location: [facility] ENDOSCOPY;  Service: Gastroenterology;  Laterality: N/A;   DILATION AND CURETTAGE OF UTERUS     x2   ESOPHAGOGASTRODUODENOSCOPY (EGD) WITH PROPOFOL  N/A 09/21/2017   Procedure: ESOPHAGOGASTRODUODENOSCOPY (EGD) WITH PROPOFOL ;  Surgeon: Kung Ruel, MD;  Location: [facility] ENDOSCOPY;  Service:  Gastroenterology;  Laterality: N/A;   ovarian cyst removed      Prior to Admission medications   Medication Sig Start Date End Date Taking? Authorizing Provider  buprenorphine (SUBUTEX) 2 MG SUBL SL tablet Place 2 mg under the tongue in the morning, at noon, and at bedtime.    [provider]  gabapentin  (NEURONTIN ) 300 MG capsule Take 300 mg by mouth every morning.    [provider]  losartan-hydrochlorothiazide (HYZAAR) 100-25 MG tablet TAKE 1 TABLET BY MOUTH ONCE DAILY Patient taking differently: Take 1 tablet by mouth daily with lunch. 03/01/20   Britta King, MD  oxyCODONE -acetaminophen  (PERCOCET) 5-325 MG tablet Take 1 tablet by mouth every 8 (eight) hours as needed for severe pain (pain score 7-10). 11/19/23 11/18/24  Sakai, Isami, DO  potassium chloride  SA (KLOR-CON  M) 20 MEQ tablet Take 3 tablets (60 mEq) today, then take 1 tablet (20 mEq) prior to surgery tomorrow.  Follow-up with PCP for repeat labs. 11/18/23   Gray, Bryan E, NP  rosuvastatin (CRESTOR) 20 MG tablet TAKE 1 TABLET BY MOUTH ONCE DAILY 03/01/20   Masoud, Javed, MD  Vitamin D, Ergocalciferol, (DRISDOL) 1.25 MG (50000 UNIT) CAPS capsule Take 50,000 Units by mouth once a week. 08/31/23   [provider]    Allergies as of 12/24/2023 - Review Complete 11/19/2023  Allergen Reaction Noted   Sulfa antibiotics Shortness Of Breath 09/14/2013  Family History  Problem Relation Age of Onset   Breast cancer Mother 41   Heart disease Mother    Cancer Father    Rheum arthritis Sister    Ovarian cancer Sister    Breast cancer Maternal Aunt 66   Cancer Maternal Aunt     Social History   Socioeconomic History   Marital status: Married    Spouse name: Not on file   Number of children: Not on file   Years of education: Not on file   Highest education level: Not on file  Occupational History   Not on file  Tobacco Use   Smoking status: Every Day    Current packs/day: 2.00    Types: Cigarettes    Smokeless tobacco: Never  Vaping Use   Vaping status: Never Used  Substance and Sexual Activity   Alcohol use: No   Drug use: No   Sexual activity: Yes  Other Topics Concern   Not on file  Social History Narrative   Not on file   Social Drivers of Health   Financial Resource Strain: Medium Risk (12/23/2023)   Received from [facility]   Overall Financial Resource Strain (CARDIA)    Difficulty of Paying Living Expenses: Somewhat hard  Food Insecurity: No Food Insecurity (12/23/2023)   [facility]   Hunger Vital Sign    Within the past 12 months, you worried that your food would run out before you got the money to buy more.: Never true    Within the past 12 months, the food you bought just didn't last and you didn't have money to get more.: Never true  Recent Concern: Food Insecurity - Food Insecurity Present (11/09/2023)   Received from [facility]   Hunger Vital Sign    Within the past 12 months, you worried that your food would run out before you got the money to buy more.: Sometimes true    Within the past 12 months, the food you bought just didn't last and you didn't have money to get more.: Sometimes true  Transportation Needs: No Transportation Needs (12/23/2023)   Received from [facility] - Transportation    In the past 12 months, has lack of transportation kept you from medical appointments or from getting medications?: No    Lack of Transportation (Non-Medical): No  Physical Activity: Not on file  Stress: Not on file  Social Connections: Not on file  Intimate Partner Violence: Not on file    Review of Systems: See HPI, otherwise negative ROS  Physical Exam: LMP 06/15/2016 (Exact Date)  General:   Alert,  pleasant and cooperative in NAD Head:  Normocephalic and atraumatic. Neck:  Supple; no masses or thyromegaly. Lungs:  Clear throughout to auscultation, normal  respiratory effort.    Heart:  +S1, +S2, Regular rate and rhythm, No edema. Abdomen:  Soft, nontender and nondistended. Normal bowel sounds, without guarding, and without rebound.   Neurologic:  Alert and  oriented x4;  grossly normal neurologically.  Impression/Plan: Andrea Frazier is here for an endoscopy and colonoscopy  to be performed for  evaluation of abdominal pain and dysphagia    Risks, benefits, limitations, and alternatives regarding endoscopy have been reviewed with the patient.  Questions have been answered.  All parties agreeable.   Ruel Kung, MD  01/17/2024, 10:27 AM

## 2024-01-18 ENCOUNTER — Encounter: Payer: Self-pay | Admitting: Gastroenterology

## 2024-01-19 LAB — SURGICAL PATHOLOGY
